# Patient Record
Sex: Male | Born: 1960 | Race: White | Hispanic: No | Marital: Married | Smoking: Never smoker
Health system: Southern US, Community
[De-identification: ages and names within clinical notes are randomized; demographics above are authoritative.]

## PROBLEM LIST (undated history)

## (undated) DIAGNOSIS — G2401 Drug induced subacute dyskinesia: Secondary | ICD-10-CM

## (undated) DIAGNOSIS — F3181 Bipolar II disorder: Secondary | ICD-10-CM

## (undated) DIAGNOSIS — E785 Hyperlipidemia, unspecified: Secondary | ICD-10-CM

## (undated) DIAGNOSIS — R569 Unspecified convulsions: Secondary | ICD-10-CM

## (undated) DIAGNOSIS — T7840XA Allergy, unspecified, initial encounter: Secondary | ICD-10-CM

## (undated) DIAGNOSIS — M1990 Unspecified osteoarthritis, unspecified site: Secondary | ICD-10-CM

## (undated) DIAGNOSIS — G4730 Sleep apnea, unspecified: Secondary | ICD-10-CM

## (undated) DIAGNOSIS — F32A Depression, unspecified: Secondary | ICD-10-CM

## (undated) DIAGNOSIS — R7303 Prediabetes: Secondary | ICD-10-CM

## (undated) DIAGNOSIS — F419 Anxiety disorder, unspecified: Secondary | ICD-10-CM

## (undated) DIAGNOSIS — K219 Gastro-esophageal reflux disease without esophagitis: Secondary | ICD-10-CM

## (undated) DIAGNOSIS — F329 Major depressive disorder, single episode, unspecified: Secondary | ICD-10-CM

## (undated) DIAGNOSIS — R0600 Dyspnea, unspecified: Secondary | ICD-10-CM

## (undated) HISTORY — DX: Gastro-esophageal reflux disease without esophagitis: K21.9

## (undated) HISTORY — DX: Hyperlipidemia, unspecified: E78.5

## (undated) HISTORY — DX: Allergy, unspecified, initial encounter: T78.40XA

## (undated) HISTORY — DX: Depression, unspecified: F32.A

## (undated) HISTORY — DX: Sleep apnea, unspecified: G47.30

## (undated) HISTORY — DX: Bipolar II disorder: F31.81

## (undated) HISTORY — DX: Major depressive disorder, single episode, unspecified: F32.9

---

## 1983-11-27 HISTORY — PX: WISDOM TOOTH EXTRACTION: SHX21

## 2003-04-05 ENCOUNTER — Ambulatory Visit (HOSPITAL_BASED_OUTPATIENT_CLINIC_OR_DEPARTMENT_OTHER): Admission: RE | Admit: 2003-04-05 | Discharge: 2003-04-05 | Payer: Self-pay | Admitting: Family Medicine

## 2003-05-27 ENCOUNTER — Ambulatory Visit (HOSPITAL_BASED_OUTPATIENT_CLINIC_OR_DEPARTMENT_OTHER): Admission: RE | Admit: 2003-05-27 | Discharge: 2003-05-27 | Payer: Self-pay | Admitting: Family Medicine

## 2004-10-12 ENCOUNTER — Emergency Department (HOSPITAL_COMMUNITY): Admission: EM | Admit: 2004-10-12 | Discharge: 2004-10-12 | Payer: Self-pay | Admitting: Emergency Medicine

## 2007-09-29 ENCOUNTER — Encounter: Admission: RE | Admit: 2007-09-29 | Discharge: 2007-09-29 | Payer: Self-pay | Admitting: Family Medicine

## 2007-12-16 ENCOUNTER — Ambulatory Visit (HOSPITAL_COMMUNITY): Admission: RE | Admit: 2007-12-16 | Discharge: 2007-12-16 | Payer: Self-pay | Admitting: Gastroenterology

## 2008-04-12 ENCOUNTER — Encounter: Admission: RE | Admit: 2008-04-12 | Discharge: 2008-04-12 | Payer: Self-pay

## 2008-10-28 ENCOUNTER — Ambulatory Visit (HOSPITAL_COMMUNITY): Payer: Self-pay | Admitting: Psychology

## 2008-11-11 ENCOUNTER — Ambulatory Visit (HOSPITAL_COMMUNITY): Payer: Self-pay | Admitting: Psychology

## 2008-11-23 ENCOUNTER — Ambulatory Visit (HOSPITAL_COMMUNITY): Payer: Self-pay | Admitting: Psychology

## 2009-03-15 ENCOUNTER — Ambulatory Visit (HOSPITAL_COMMUNITY): Payer: Self-pay | Admitting: Psychology

## 2009-03-22 ENCOUNTER — Encounter: Admission: RE | Admit: 2009-03-22 | Discharge: 2009-05-09 | Payer: Self-pay | Admitting: Family Medicine

## 2009-04-14 ENCOUNTER — Ambulatory Visit (HOSPITAL_COMMUNITY): Payer: Self-pay | Admitting: Psychology

## 2009-04-15 ENCOUNTER — Encounter: Admission: RE | Admit: 2009-04-15 | Discharge: 2009-04-15 | Payer: Self-pay | Admitting: Family Medicine

## 2009-04-27 ENCOUNTER — Encounter: Admission: RE | Admit: 2009-04-27 | Discharge: 2009-04-27 | Payer: Self-pay | Admitting: Internal Medicine

## 2009-05-13 ENCOUNTER — Ambulatory Visit (HOSPITAL_COMMUNITY): Payer: Self-pay | Admitting: Psychology

## 2009-11-07 ENCOUNTER — Encounter: Admission: RE | Admit: 2009-11-07 | Discharge: 2009-11-22 | Payer: Self-pay | Admitting: Family Medicine

## 2009-12-01 ENCOUNTER — Encounter: Admission: RE | Admit: 2009-12-01 | Discharge: 2009-12-08 | Payer: Self-pay | Admitting: Family Medicine

## 2010-09-27 ENCOUNTER — Encounter: Admission: RE | Admit: 2010-09-27 | Discharge: 2010-09-27 | Payer: Self-pay | Admitting: Family Medicine

## 2010-12-16 ENCOUNTER — Emergency Department (HOSPITAL_COMMUNITY)
Admission: EM | Admit: 2010-12-16 | Discharge: 2010-12-16 | Payer: Self-pay | Source: Home / Self Care | Admitting: Family Medicine

## 2010-12-16 ENCOUNTER — Emergency Department: Payer: Self-pay | Admitting: Unknown Physician Specialty

## 2010-12-18 ENCOUNTER — Emergency Department (HOSPITAL_COMMUNITY)
Admission: EM | Admit: 2010-12-18 | Discharge: 2010-12-18 | Payer: Self-pay | Source: Home / Self Care | Admitting: Family Medicine

## 2011-04-10 NOTE — Op Note (Signed)
Ryan Phelps, Ryan Phelps                ACCOUNT NO.:  [id number]   MEDICAL RECORD NO.:  [id number]          PATIENT TYPE:  AMB   LOCATION:  ENDO                         FACILITY:  [facility]   PHYSICIAN:  Graylin Shiver, M.D.   DATE OF BIRTH:  04/24/1961   DATE OF PROCEDURE:  12/16/2007  DATE OF DISCHARGE:                               OPERATIVE REPORT   Esophagogastroduodenoscopy with endoscopic balloon dilatation of  Schatzki's ring.   INDICATIONS FOR PROCEDURE:  Dysphagia.   Informed consent was obtained after explanation of the risks of  bleeding, infection and perforation.   PREMEDICATION:  Fentanyl 100 mcg IV, Versed 10 mg IV.   PROCEDURE:  With the patient in the left lateral decubitus position, the  Pentax gastroscope was inserted into the oropharynx and passed into the  esophagus.  It was advanced down the esophagus, then into the stomach  and into the duodenum.  The second portion and bulb of the duodenum were  normal.  The stomach had normal-appearing mucosa.  There was a moderate-  sized hiatal hernia.  In the esophagus, there was a Schatzki's ring  somewhat constricting this was dilated to 15, then 16.5, then 18 mm.  Each level was held in place for 1 minute.  The rest of the esophagus  looked normal.  He tolerated the procedure well without complications.   IMPRESSION:  1. Schatzki's ring.  2. Moderate-sized hiatal hernia.   PLAN:  Observe response to dilatation.           ______________________________  Graylin Shiver, M.D.     SFG/MEDQ  D:  12/16/2007  T:  12/16/2007  Job:  [id number]   cc:   Duncan Dull, M.D.  Fax: [phone]

## 2013-04-27 ENCOUNTER — Ambulatory Visit: Payer: 59 | Attending: Family Medicine | Admitting: Physical Therapy

## 2013-04-27 DIAGNOSIS — M545 Low back pain, unspecified: Secondary | ICD-10-CM | POA: Insufficient documentation

## 2013-04-27 DIAGNOSIS — IMO0001 Reserved for inherently not codable concepts without codable children: Secondary | ICD-10-CM | POA: Insufficient documentation

## 2013-04-27 DIAGNOSIS — M546 Pain in thoracic spine: Secondary | ICD-10-CM | POA: Insufficient documentation

## 2013-04-28 ENCOUNTER — Ambulatory Visit: Payer: 59

## 2013-05-11 ENCOUNTER — Ambulatory Visit: Payer: 59 | Admitting: Physical Therapy

## 2013-05-11 ENCOUNTER — Encounter: Payer: 59 | Admitting: Physical Therapy

## 2013-05-12 ENCOUNTER — Ambulatory Visit: Payer: 59 | Admitting: Physical Therapy

## 2013-05-14 ENCOUNTER — Ambulatory Visit: Payer: 59 | Admitting: Physical Therapy

## 2013-05-18 ENCOUNTER — Ambulatory Visit: Payer: 59 | Admitting: Physical Therapy

## 2013-05-20 ENCOUNTER — Ambulatory Visit: Payer: 59 | Admitting: Physical Therapy

## 2013-05-22 ENCOUNTER — Ambulatory Visit: Payer: 59 | Admitting: Physical Therapy

## 2013-05-25 ENCOUNTER — Ambulatory Visit: Payer: 59 | Admitting: Physical Therapy

## 2013-05-26 ENCOUNTER — Ambulatory Visit: Payer: 59 | Admitting: Physical Therapy

## 2013-05-27 ENCOUNTER — Ambulatory Visit: Payer: 59 | Attending: Family Medicine | Admitting: Physical Therapy

## 2013-05-27 DIAGNOSIS — M545 Low back pain, unspecified: Secondary | ICD-10-CM | POA: Insufficient documentation

## 2013-05-27 DIAGNOSIS — IMO0001 Reserved for inherently not codable concepts without codable children: Secondary | ICD-10-CM | POA: Insufficient documentation

## 2013-05-27 DIAGNOSIS — M546 Pain in thoracic spine: Secondary | ICD-10-CM | POA: Insufficient documentation

## 2013-05-28 ENCOUNTER — Ambulatory Visit: Payer: 59 | Admitting: Physical Therapy

## 2013-06-01 ENCOUNTER — Ambulatory Visit: Payer: 59 | Admitting: Physical Therapy

## 2013-06-03 ENCOUNTER — Ambulatory Visit: Payer: 59 | Admitting: Physical Therapy

## 2013-06-04 ENCOUNTER — Encounter: Payer: 59 | Admitting: Physical Therapy

## 2013-06-04 ENCOUNTER — Ambulatory Visit: Payer: 59

## 2013-06-08 ENCOUNTER — Ambulatory Visit: Payer: 59 | Admitting: Physical Therapy

## 2013-06-10 ENCOUNTER — Ambulatory Visit: Payer: 59 | Admitting: Physical Therapy

## 2013-06-15 ENCOUNTER — Ambulatory Visit: Payer: 59 | Admitting: Physical Therapy

## 2013-06-17 ENCOUNTER — Ambulatory Visit: Payer: 59 | Admitting: Physical Therapy

## 2013-06-22 ENCOUNTER — Ambulatory Visit: Payer: 59 | Admitting: Physical Therapy

## 2014-02-22 ENCOUNTER — Other Ambulatory Visit: Payer: Self-pay | Admitting: Family Medicine

## 2014-02-22 ENCOUNTER — Ambulatory Visit
Admission: RE | Admit: 2014-02-22 | Discharge: 2014-02-22 | Disposition: A | Discharge status: Home / Self Care | Payer: 59 | Source: Ambulatory Visit | Attending: Family Medicine | Admitting: Family Medicine

## 2014-02-22 DIAGNOSIS — M549 Dorsalgia, unspecified: Secondary | ICD-10-CM

## 2015-11-02 ENCOUNTER — Ambulatory Visit: Payer: 59 | Attending: Family Medicine

## 2015-11-02 DIAGNOSIS — M545 Low back pain, unspecified: Secondary | ICD-10-CM

## 2015-11-02 DIAGNOSIS — M25659 Stiffness of unspecified hip, not elsewhere classified: Secondary | ICD-10-CM | POA: Diagnosis present

## 2015-11-02 DIAGNOSIS — G729 Myopathy, unspecified: Secondary | ICD-10-CM | POA: Insufficient documentation

## 2015-11-02 DIAGNOSIS — M6289 Other specified disorders of muscle: Secondary | ICD-10-CM

## 2015-11-02 NOTE — Patient Instructions (Signed)
Perform all exercises below:  Hold _20___ seconds. Repeat _3___ times.  Do __3__ sessions per day. CAUTION: Movement should be gentle, steady and slow.  Knee to Chest  Lying supine, bend involved knee to chest. Perform with each leg.   Lumbar Rotation: Caudal - Bilateral (Supine)  Feet and knees together, arms outstretched, rotate knees left, turning head in opposite direction, until stretch is felt.    Cervico-Thoracic: Extension / Rotation (Sitting)    Reach across body with left arm and grasp back of chair. Gently look over right side shoulder. Hold _20___ seconds. Relax. Repeat __3__ times per set. Do __1__ sets per session. Do __3__ sessions per day.  http://orth.exer.us/980   Copyright  VHI. All rights reserved.    HIP: Hamstrings - Short Sitting   Rest leg on raised surface. Keep knee straight. Lift chest.   [facility] [address], [address] [place], [place] [address] Phone # [phone] Fax [phone]

## 2015-11-02 NOTE — Therapy (Signed)
[facility] [address] [place], [place], [address] Phone: [phone]   Fax:  [phone]  Physical Therapy Evaluation  Patient Details  Name: Ryan Phelps MRN: [id number] Date of Birth: [birth year] Referring Provider: Shaune PollackGates, Donna, MD  Encounter Date: 11/02/2015      PT End of Session - 11/02/15 1605    Visit Number 1   Date for PT Re-Evaluation 12/28/15   PT Start Time 1531   PT Stop Time 1606   PT Time Calculation (min) 35 min   Activity Tolerance Patient tolerated treatment well   Behavior [facility] Therapy [facility] for tasks assessed/performed      Past Medical History  Diagnosis Date  . Depression   . GERD (gastroesophageal reflux disease)   . Hyperlipidemia   . Allergy     History reviewed. No pertinent past surgical history.  There were no vitals filed for this visit.  Visit Diagnosis:  Right-sided low back pain without sciatica - Plan: PT plan of care cert/re-cert  Hip stiffness, unspecified laterality - Plan: PT plan of care cert/re-cert  Muscle stiffness - Plan: PT plan of care cert/re-cert      Subjective Assessment - 11/02/15 1538    Subjective Pt reports to PT with 3-4 year history of Rt>Lt sided LBP. Pt was washing the top of his car and slid off and hit his back.  Pt has had PT in the past and responded well to it.     Limitations Standing   How long can you stand comfortably? 30 minutes   Diagnostic tests none recent.  Old imaging showed lumbar DDD per pt report   Patient Stated Goals reduce pain   Currently in Pain? Yes   Pain Score 8   when it occurs.  No pain now.   Pain Location Back   Pain Orientation Right;Lower;Mid   Pain Descriptors / Marine scientistndicators Shooting;Stabbing   Pain Type Chronic pain   Pain Onset More than a month ago   Pain Frequency Intermittent   Aggravating Factors  yard work (raking leaves), standing   Pain Relieving Factors heat, TENs unit, flat on back             [facility] Assessment - 11/02/15 0001    Assessment   Medical Diagnosis Low back pain (M54.5)   Referring Provider Shaune PollackGates, Donna, MD   Onset Date/Surgical Date 07/02/12   Next MD Visit none   Prior Therapy 2 years ago at this clinic   Precautions   Precautions None   Restrictions   Weight Bearing Restrictions No   Balance Screen   Has the patient fallen in the past 6 months No   Has the patient had a decrease in activity level because of a fear of falling?  No   Is the patient reluctant to leave their home because of a fear of falling?  No   Home Environment   Living Environment Private residence   Living Arrangements Spouse/significant other   Type of Home House   Home Layout Two level   Prior Function   Level of Independence Independent   Vocation On disability  due to depression   Leisure drawing, writing   Cognition   Overall Cognitive Status Within Functional Limits for tasks assessed   Observation/Other Assessments   Focus on Therapeutic Outcomes (FOTO)  52% limitation   Posture/Postural Control   Posture/Postural Control Postural limitations   Postural Limitations Decreased lumbar lordosis;Increased thoracic kyphosis;Flexed trunk   ROM /  Strength   AROM / PROM / Strength AROM;PROM;Strength   AROM   Overall AROM  Deficits   Overall AROM Comments Lumbar flexion limited by 50% with pain, sidebending limited by 25% with Rt pain with Lt sidebending, rotation limited by 25% with pain with Lt rotation.   PROM   Overall PROM  Deficits   Overall PROM Comments Bil hip flexibility limited by 25-50% bilaterally   Strength   Overall Strength Deficits   Overall Strength Comments Bil hip and knees 4+/5 throughout   Palpation   Spinal mobility significant mobility deficits (>50%) in the thoracic and lumbar spine   Palpation comment Palpable tenderness and trigger point over Rt thoracic and lumbar paraspinals.  Tension on the Lt without pain,     Ambulation/Gait    Ambulation/Gait Yes   Ambulation/Gait Assistance 7: Independent                           PT Education - 11/02/15 1559    Education provided Yes   Education Details HEP: lumbar flexibility, seated hamstring, seated thoracic rotation   Person(s) Educated Patient   Methods Explanation;Demonstration;Handout   Comprehension Verbalized understanding;Returned demonstration          PT Short Term Goals - 11/02/15 1609    PT SHORT TERM GOAL #1   Title be independent in initial HEP   Time 4   Period Weeks   Status New   PT SHORT TERM GOAL #2   Title report a 25% reduction in LBP with ADLs and self-care   Time 4   Period Weeks   Status New   PT SHORT TERM GOAL #3   Title demonstrate correct body mechanics with home tasks to protect lumbar spine.   Time 4   Period Weeks   Status New           PT Long Term Goals - 11/02/15 1531    PT LONG TERM GOAL #1   Title be independent in advanced HEP   Time 8   Period Weeks   Status New   PT LONG TERM GOAL #2   Title reduce FOTO to < or = to 41% limitation   Time 8   Period Weeks   Status New   PT LONG TERM GOAL #3   Title report a 60% reduction in LBP/thoracic pain with ADLs and self-care   Time 8   Period Weeks   Status New               Plan - 11/02/15 1606    Clinical Impression Statement Pt presents to PT with 3-4 year history of chronic Rt sided thoracic and lumbar pain.  Pt demonstrates limited lumbar and hip flexibility, reduced segmental mobility and tension/active trigger points in the Rt thoracic and lumbar spine.  FOTO score is 52% limitation.  Pt will benefit from skilled PT for body mechanics education, flexibiity and manual/modalities to imrpove mobility and reduce pain with ADLs and home tasks.     Pt will benefit from skilled therapeutic intervention in order to improve on the following deficits Postural dysfunction;Decreased mobility;Hypomobility;Impaired flexibility;Improper body  mechanics;Pain;Decreased activity tolerance;Decreased endurance;Decreased range of motion   Rehab Potential Good   PT Frequency 2x / week   PT Duration 8 weeks   PT Treatment/Interventions ADLs/Self Care Home Management;Cryotherapy;Electrical Stimulation;Moist Heat;Therapeutic exercise;Therapeutic activities;Ultrasound;Neuromuscular re-education;Patient/family education;Manual techniques;Dry needling;Passive range of motion   PT Next Visit Plan Body mechanics education.  Advance flexibility  in the thoracic and lumbar spine.  Manual and modalities to thoracic/lumbar spine and Rt paraspinals   Consulted and Agree with Plan of Care Patient         Problem List There are no active problems to display for this patient.   Monte Bronder, PT 11/02/2015, 4:14 PM  [facility] [address], [address] [place], [place], [address] Phone: [phone]   Fax:  [phone]  Name: Ryan Phelps MRN: [id number] Date of Birth: [birth year]

## 2015-11-08 ENCOUNTER — Ambulatory Visit: Payer: 59

## 2015-11-08 DIAGNOSIS — M545 Low back pain, unspecified: Secondary | ICD-10-CM

## 2015-11-08 DIAGNOSIS — M25659 Stiffness of unspecified hip, not elsewhere classified: Secondary | ICD-10-CM

## 2015-11-08 DIAGNOSIS — M6289 Other specified disorders of muscle: Secondary | ICD-10-CM

## 2015-11-08 NOTE — Therapy (Signed)
[facility] [address], [address] [place], [place], [address] Phone: [phone]   Fax:  [phone]  Physical Therapy Treatment  Patient Details  Name: Ryan Phelps MRN: [id number] Date of Birth: Mar 30, 1961 Referring Provider: Shaune Pollack, MD  Encounter Date: 11/08/2015      PT End of Session - 11/08/15 1444    Visit Number 2   Date for PT Re-Evaluation 12/28/15   PT Start Time 1400   PT Stop Time 1444   PT Time Calculation (min) 44 min   Activity Tolerance Patient tolerated treatment well   Behavior During Therapy [facility] for tasks assessed/performed      Past Medical History  Diagnosis Date  . Depression   . GERD (gastroesophageal reflux disease)   . Hyperlipidemia   . Allergy     History reviewed. No pertinent past surgical history.  There were no vitals filed for this visit.  Visit Diagnosis:  Right-sided low back pain without sciatica  Hip stiffness, unspecified laterality  Muscle stiffness      Subjective Assessment - 11/08/15 1401    Subjective Pt reports compliance with HEP.  No changes in pain since the start of care.     Currently in Pain? Yes   Pain Score 7    Pain Location Back   Pain Orientation Upper;Right;Mid;Lower   Pain Descriptors / Indicators Shooting;Stabbing   Pain Type Chronic pain   Pain Onset More than a month ago   Pain Frequency Intermittent   Aggravating Factors  yard work, raking leaves, standing   Pain Relieving Factors heat, TENs unit, flat on back                         [facility] Adult PT Treatment/Exercise - 11/08/15 0001    Exercises   Exercises Shoulder;Lumbar;Knee/Hip   Lumbar Exercises: Stretches   Active Hamstring Stretch 3 reps;20 seconds   Single Knee to Chest Stretch 3 reps;20 seconds   Lower Trunk Rotation 3 reps;20 seconds   Lumbar Exercises: Seated   Other Seated Lumbar Exercises seated thoracolumbar rotation 3x20 seconds   Other Seated  Lumbar Exercises rhomboid stretch with arms crossed over legs 3x20 seconds   Lumbar Exercises: Supine   Other Supine Lumbar Exercises supine on foam roll for decompression x 4 minutes   Shoulder Exercises: Supine   Horizontal ABduction Strengthening;20 reps;Theraband  on foam roll   Shoulder Exercises: ROM/Strengthening   UBE (Upper Arm Bike) Level 1x 6 minutes (3/3)  verbal cues for posture   Manual Therapy   Manual Therapy Joint mobilization;Soft tissue mobilization   Manual therapy comments PA glides to T3-L1 grade 3   Soft tissue mobilization orange roller ball to mobilize Rt thoracic and lumbar parapsinals                  PT Short Term Goals - 11/08/15 1405    PT SHORT TERM GOAL #1   Title be independent in initial HEP   Time 4   Period Weeks   Status On-going  1st visit after evaluation   PT SHORT TERM GOAL #2   Title report a 25% reduction in LBP with ADLs and self-care   Time 4   Period Weeks   Status On-going           PT Long Term Goals - 11/02/15 1531    PT LONG TERM GOAL #1   Title be independent in advanced HEP   Time  8   Period Weeks   Status New   PT LONG TERM GOAL #2   Title reduce FOTO to < or = to 41% limitation   Time 8   Period Weeks   Status New   PT LONG TERM GOAL #3   Title report a 60% reduction in LBP/thoracic pain with ADLs and self-care   Time 8   Period Weeks   Status New               Plan - 11/08/15 1407    Clinical Impression Statement Pt with only 1 session after evaluation.  Pt with chronic history of Rt sided thoracic and lumbar pain.  Pt demonstrates limited lumbar and hip flexibility, reduced segmental mobiliity and tension/active trigger points in the Rt thoracic and lumbar spine.  Pt will benefit from skilled PT for body mechanics education, flexibility and manual/modalities to improve mobility and reduce pain with ADLs and home tasks.     Pt will benefit from skilled therapeutic intervention in order to  improve on the following deficits Postural dysfunction;Decreased mobility;Hypomobility;Impaired flexibility;Improper body mechanics;Pain;Decreased activity tolerance;Decreased endurance;Decreased range of motion   Rehab Potential Good   PT Frequency 2x / week   PT Duration 8 weeks   PT Treatment/Interventions ADLs/Self Care Home Management;Cryotherapy;Electrical Stimulation;Moist Heat;Therapeutic exercise;Therapeutic activities;Ultrasound;Neuromuscular re-education;Patient/family education;Manual techniques;Dry needling;Passive range of motion   PT Next Visit Plan Body mechanics education.  Advance flexibility in the thoracic and lumbar spine.  Manual and modalities to thoracic/lumbar spine and Rt paraspinals   Consulted and Agree with Plan of Care Patient        Problem List There are no active problems to display for this patient.   TAKACS,KELLY, PT 11/08/2015, 2:46 PM  [facility] [address], [address] [place], [place], [address] Phone: [phone]   Fax:  [phone]  Name: Richardean Canalhomas L Hadlock MRN: [id number] Date of Birth: 05-Nov-1961

## 2015-11-10 ENCOUNTER — Encounter: Payer: 59 | Admitting: Physical Therapy

## 2015-11-15 ENCOUNTER — Ambulatory Visit: Payer: 59

## 2015-11-15 DIAGNOSIS — M545 Low back pain, unspecified: Secondary | ICD-10-CM

## 2015-11-15 DIAGNOSIS — M6289 Other specified disorders of muscle: Secondary | ICD-10-CM

## 2015-11-15 DIAGNOSIS — M25659 Stiffness of unspecified hip, not elsewhere classified: Secondary | ICD-10-CM

## 2015-11-15 NOTE — Patient Instructions (Signed)
   Lifting Principles  .Maintain proper posture and head alignment. .Slide object as close as possible before lifting. .Move obstacles out of the way. .Test before lifting; ask for help if too heavy. .Tighten stomach muscles without holding breath. .Use smooth movements; do not jerk. .Use legs to do the work, and pivot with feet. .Distribute the work load symmetrically and close to the center of trunk. .Push instead of pull whenever possible.   Squat down and hold basket close to stand. Use leg muscles to do the work.    Avoid twisting or bending back. Pivot around using foot movements, and bend at knees if needed when reaching for articles.        Getting Into / Out of Bed   Lower self to lie down on one side by raising legs and lowering head at the same time. Use arms to assist moving without twisting. Bend both knees to roll onto back if desired. To sit up, start from lying on side, and use same move-ments in reverse. Keep trunk aligned with legs.    Shift weight from front foot to back foot as item is lifted off shelf.    When leaning forward to pick object up from floor, extend one leg out behind. Keep back straight. Hold onto a sturdy support with other hand.      Sit upright, head facing forward. Try using a roll to support lower back. Keep shoulders relaxed, and avoid rounded back. Keep hips level with knees. Avoid crossing legs for long periods.    Resisted Horizontal Abduction: Bilateral   Sit or stand, tubing in both hands, arms out in front. Keeping arms straight, pinch shoulder blades together and stretch arms out. Repeat _10___ times per set. Do 2____ sets per session. Do _1-2___ sessions per day.    Scapular Retraction: Elbow Flexion (Standing)   With elbows bent to 90, pinch shoulder blades together and rotate arms out, keeping elbows bent. Repeat _10___ times per set. Do _1___ sets per session. Do many____ sessions per day.   [facility] [address], [address] [place], [place] [address] Phone # [phone] Fax [phone]

## 2015-11-15 NOTE — Therapy (Signed)
[facility] [address] [place], [place], [address] Phone: [phone]   Fax:  [phone]  Physical Therapy Treatment  Patient Details  Name: Ryan Phelps L Rozell MRN: [id number] Date of Birth: Sep 16, 1961 Referring Provider: Shaune PollackGates, Donna, MD  Encounter Date: 11/15/2015      PT End of Session - 11/15/15 0910    Visit Number 3   Date for PT Re-Evaluation 12/28/15   PT Start Time 0845   PT Stop Time 0924   PT Time Calculation (min) 39 min   Activity Tolerance Patient tolerated treatment well   Behavior During Therapy [facility] for tasks assessed/performed      Past Medical History  Diagnosis Date  . Depression   . GERD (gastroesophageal reflux disease)   . Hyperlipidemia   . Allergy     History reviewed. No pertinent past surgical history.  There were no vitals filed for this visit.  Visit Diagnosis:  Right-sided low back pain without sciatica  Hip stiffness, unspecified laterality  Muscle stiffness      Subjective Assessment - 11/15/15 0851    Subjective Feeling about the same.  Pt had a death in the family and had to travel over the weekend.     Diagnostic tests none recent.  Old imaging showed lumbar DDD per pt report   Currently in Pain? Yes   Pain Score 5    Pain Location Back   Pain Orientation Upper;Mid;Lower;Right   Pain Descriptors / Indicators Shooting;Stabbing   Pain Type Chronic pain   Pain Onset More than a month ago   Pain Frequency Intermittent   Aggravating Factors  yard work, raking leaves, standing    Pain Relieving Factors heat, TENs unit, flat on back                         [facility] Adult PT Treatment/Exercise - 11/15/15 0001    Lumbar Exercises: Stretches   Active Hamstring Stretch 3 reps;20 seconds   Single Knee to Chest Stretch 3 reps;20 seconds   Lower Trunk Rotation 3 reps;20 seconds   Lumbar Exercises: Seated   Other Seated Lumbar Exercises seated  thoracolumbar rotation 3x20 seconds   Other Seated Lumbar Exercises rhomboid stretch with arms crossed over legs 3x20 seconds   Lumbar Exercises: Supine   Other Supine Lumbar Exercises supine on foam roll for decompression x 4 minutes   Shoulder Exercises: Supine   Horizontal ABduction Strengthening;20 reps;Theraband  on foam roll   Shoulder Exercises: ROM/Strengthening   UBE (Upper Arm Bike) Level 1x 6 minutes (3/3)  verbal cues for posture   Manual Therapy   Manual Therapy Joint mobilization;Soft tissue mobilization   Manual therapy comments PA glides to T3-L1 grade 3   Soft tissue mobilization orange roller ball to mobilize Rt thoracic and lumbar parapsinals                PT Education - 11/15/15 0904    Education provided Yes   Education Details Horizontal abduction, body mechanics education   Person(s) Educated Patient   Methods Explanation;Demonstration;Handout   Comprehension Verbalized understanding;Returned demonstration          PT Short Term Goals - 11/15/15 0854    PT SHORT TERM GOAL #1   Title be independent in initial HEP   Status Achieved   PT SHORT TERM GOAL #2   Title report a 25% reduction in LBP with ADLs and self-care   Time 4  Period Weeks   Status On-going  10% improvement   PT SHORT TERM GOAL #3   Status Achieved           PT Long Term Goals - 11/02/15 1531    PT LONG TERM GOAL #1   Title be independent in advanced HEP   Time 8   Period Weeks   Status New   PT LONG TERM GOAL #2   Title reduce FOTO to < or = to 41% limitation   Time 8   Period Weeks   Status New   PT LONG TERM GOAL #3   Title report a 60% reduction in LBP/thoracic pain with ADLs and self-care   Time 8   Period Weeks   Status New               Plan - 11/15/15 1610    Clinical Impression Statement Pt is independent and compliant with HEP for flexibility and pt reports that he feels 10% better since the start of care.  Pt demonstrates limited lumbar  and hip flexibility, reduced segmental mobility and tension/active trigger points in the Rt thoracic and lumbar spine.  Pt will benefit from skilled PT for flexibility, strength and manual/modalities to improve mobility and reduce pain with ADLs and home tasks.   Pt will benefit from skilled therapeutic intervention in order to improve on the following deficits Postural dysfunction;Decreased mobility;Hypomobility;Impaired flexibility;Improper body mechanics;Pain;Decreased activity tolerance;Decreased endurance;Decreased range of motion   Rehab Potential Good   PT Frequency 2x / week   PT Duration 8 weeks   PT Treatment/Interventions ADLs/Self Care Home Management;Cryotherapy;Electrical Stimulation;Moist Heat;Therapeutic exercise;Therapeutic activities;Ultrasound;Neuromuscular re-education;Patient/family education;Manual techniques;Dry needling;Passive range of motion   PT Next Visit Plan   Advance flexibility in the thoracic and lumbar spine.  Manual and modalities to thoracic/lumbar spine and Rt paraspinals   Consulted and Agree with Plan of Care Patient        Problem List There are no active problems to display for this patient.   [name], PT 11/15/2015, 9:27 AM  [facility] [address], [address] [place], [place], [address] Phone: [phone]   Fax:  [phone]  Name: Ryan Phelps MRN: [id number] Date of Birth: 07/11/1961

## 2015-11-17 ENCOUNTER — Encounter: Payer: Self-pay | Admitting: Rehabilitation

## 2015-11-17 ENCOUNTER — Ambulatory Visit: Payer: 59 | Admitting: Rehabilitation

## 2015-11-17 DIAGNOSIS — M6289 Other specified disorders of muscle: Secondary | ICD-10-CM

## 2015-11-17 DIAGNOSIS — M545 Low back pain, unspecified: Secondary | ICD-10-CM

## 2015-11-17 DIAGNOSIS — M25659 Stiffness of unspecified hip, not elsewhere classified: Secondary | ICD-10-CM

## 2015-11-17 NOTE — Therapy (Signed)
[facility] [address], [address] [place], [place], [address] Phone: [phone]   Fax:  [phone]  Physical Therapy Treatment  Patient Details  Name: Ryan Phelps MRN: [id number] Date of Birth: 24-Apr-1961 Referring Provider: Shaune PollackGates, Donna, MD  Encounter Date: 11/17/2015      PT End of Session - 11/17/15 1440    Visit Number 4   Date for PT Re-Evaluation 12/28/15   PT Start Time 1400   PT Stop Time 1455   PT Time Calculation (min) 55 min   Activity Tolerance Patient tolerated treatment well      Past Medical History  Diagnosis Date  . Depression   . GERD (gastroesophageal reflux disease)   . Hyperlipidemia   . Allergy     History reviewed. No pertinent past surgical history.  There were no vitals filed for this visit.  Visit Diagnosis:  Right-sided low back pain without sciatica  Hip stiffness, unspecified laterality  Muscle stiffness      Subjective Assessment - 11/17/15 1402    Subjective Feeling better.  loosening up overall.  pain is still upper and lower   Currently in Pain? Yes   Pain Score 3    Pain Location Back   Pain Orientation Mid   Aggravating Factors  yard work, raking leaves, standing   Pain Relieving Factors heat, TENS, flat on back            [facility] PT Assessment - 11/17/15 0001    AROM   Overall AROM Comments lumbar flexion to knees, extension limited 80%, pain also with sidebending bilaterally in the R QL region.  Seated thoracic rotation also limted >50% bilaterally with pain                     [facility] Adult PT Treatment/Exercise - 11/17/15 0001    Lumbar Exercises: Stretches   Lower Trunk Rotation 2 reps;30 seconds   Lower Trunk Rotation Limitations using bolster to rest knees and arms in T position   Lumbar Exercises: Aerobic   UBE (Upper Arm Bike) Level 1 x 6min (3/3)   Lumbar Exercises: Seated   Other Seated Lumbar Exercises seated thoracic rotation  stretch with manual work to R QL; Bil x 2   Lumbar Exercises: Supine   Other Supine Lumbar Exercises supine on foam roll for decompression x 4 minutes   Lumbar Exercises: Prone   Other Prone Lumbar Exercises PPU with hips held down x 8   Shoulder Exercises: Supine   Horizontal ABduction Strengthening;20 reps;Theraband   Theraband Level (Shoulder Horizontal ABduction) Level 2 (Red)   Modalities   Modalities Electrical Stimulation;Moist Heat   Moist Heat Therapy   Number Minutes Moist Heat 15 Minutes   Moist Heat Location Lumbar Spine   Electrical Stimulation   Electrical Stimulation Location R lumbar spine   Electrical Stimulation Action IFC   Electrical Stimulation Parameters to tolerance   Electrical Stimulation Goals Pain   Manual Therapy   Manual Therapy Joint mobilization;Soft tissue mobilization   Manual therapy comments CPAs T10-L3 grade IV 3x30   Joint Mobilization     Soft tissue mobilization to R QL region                  PT Short Term Goals - 11/15/15 0854    PT SHORT TERM GOAL #1   Title be independent in initial HEP   Status Achieved   PT SHORT TERM GOAL #2   Title report  a 25% reduction in LBP with ADLs and self-care   Time 4   Period Weeks   Status On-going  10% improvement   PT SHORT TERM GOAL #3   Status Achieved           PT Long Term Goals - 11/02/15 1531    PT LONG TERM GOAL #1   Title be independent in advanced HEP   Time 8   Period Weeks   Status New   PT LONG TERM GOAL #2   Title reduce FOTO to < or = to 41% limitation   Time 8   Period Weeks   Status New   PT LONG TERM GOAL #3   Title report a 60% reduction in LBP/thoracic pain with ADLs and self-care   Time 8   Period Weeks   Status New               Plan - 11/17/15 1440    Clinical Impression Statement Lumbar AROM into flexion and rotation improved by >25% post treatment and manual work.  did MH and TENS today due to patient not being able to do it at home with  his wife OOT.     PT Next Visit Plan   Advance flexibility in the thoracic and lumbar spine.  Manual and modalities to thoracic/lumbar spine and Rt paraspinals        Problem List There are no active problems to display for this patient.   Idamae Lusher, DPT, CMP 11/17/2015, 2:42 PM  [facility] [address], [address] [place], [place], [address] Phone: [phone]   Fax:  [phone]  Name: Ryan Phelps MRN: [id number] Date of Birth: Jul 01, 1961

## 2015-11-29 ENCOUNTER — Ambulatory Visit: Payer: 59 | Attending: Family Medicine

## 2015-11-29 DIAGNOSIS — M25659 Stiffness of unspecified hip, not elsewhere classified: Secondary | ICD-10-CM | POA: Diagnosis present

## 2015-11-29 DIAGNOSIS — G729 Myopathy, unspecified: Secondary | ICD-10-CM | POA: Insufficient documentation

## 2015-11-29 DIAGNOSIS — M545 Low back pain, unspecified: Secondary | ICD-10-CM

## 2015-11-29 DIAGNOSIS — M6289 Other specified disorders of muscle: Secondary | ICD-10-CM

## 2015-11-29 NOTE — Therapy (Signed)
[facility] [address], [address] [place], [place], [address] Phone: [phone]   Fax:  [phone]  Physical Therapy Treatment  Patient Details  Name: Ryan Phelps MRN: [id number] Date of Birth: 09/08/1961 Referring Provider: Shaune PollackGates, Donna, MD  Encounter Date: 11/29/2015      PT End of Session - 11/29/15 1438    Visit Number 4   Date for PT Re-Evaluation 12/28/15   PT Start Time 1400   PT Stop Time 1455   PT Time Calculation (min) 55 min   Activity Tolerance Patient tolerated treatment well   Behavior During Therapy [facility] for tasks assessed/performed      Past Medical History  Diagnosis Date  . Depression   . GERD (gastroesophageal reflux disease)   . Hyperlipidemia   . Allergy     History reviewed. No pertinent past surgical history.  There were no vitals filed for this visit.  Visit Diagnosis:  Right-sided low back pain without sciatica  Hip stiffness, unspecified laterality  Muscle stiffness      Subjective Assessment - 11/29/15 1404    Subjective Pt reports that he has been doing a lot of traveling and has not been as sore/painful as he thought that he would be.     Currently in Pain? Yes   Pain Score 3    Pain Location Back   Pain Orientation Mid   Pain Descriptors / Indicators Shooting;Stabbing   Pain Type Chronic pain   Pain Onset More than a month ago   Pain Frequency Intermittent   Aggravating Factors  long car rides, sitting too long, twisting   Pain Relieving Factors heat, laying flat/getting spine straight                         [facility] Adult PT Treatment/Exercise - 11/29/15 0001    Lumbar Exercises: Stretches   Lower Trunk Rotation 2 reps;30 seconds   Lumbar Exercises: Supine   Other Supine Lumbar Exercises supine on foam roll for decompression x 4 minutes   Shoulder Exercises: Supine   Horizontal ABduction Strengthening;20 reps;Theraband   Theraband Level (Shoulder  Horizontal ABduction) Level 2 (Red)   Other Supine Exercises D2 with red band 2x10 bil. performed on foam roll   Shoulder Exercises: ROM/Strengthening   UBE (Upper Arm Bike) Level 1x 6 minutes (3/3)  verbal cues for posture   Modalities   Modalities Electrical Stimulation;Moist Heat   Moist Heat Therapy   Number Minutes Moist Heat 15 Minutes   Moist Heat Location Lumbar Spine   Electrical Stimulation   Electrical Stimulation Location R lumbar spine   Electrical Stimulation Action IFC   Electrical Stimulation Parameters 15 minutes   Electrical Stimulation Goals Pain   Manual Therapy   Manual Therapy Joint mobilization;Soft tissue mobilization   Manual therapy comments PA glides to T3-L1 grade 3   Soft tissue mobilization to Rt QL region and use of orange roller ball for tissue mobilization                  PT Short Term Goals - 11/29/15 1407    PT SHORT TERM GOAL #1   Title be independent in initial HEP   Status Achieved   PT SHORT TERM GOAL #2   Title report a 25% reduction in LBP with ADLs and self-care   Status Achieved   PT SHORT TERM GOAL #3   Title demonstrate correct body mechanics with home tasks to protect  lumbar spine.   Status Achieved           PT Long Term Goals - 11/29/15 1406    PT LONG TERM GOAL #1   Title be independent in advanced HEP   Time 8   Period Weeks   Status On-going   PT LONG TERM GOAL #3   Title report a 60% reduction in LBP/thoracic pain with ADLs and self-care   Time 8   Period Weeks   Status On-going  50% better               Plan - 11/29/15 1407    Clinical Impression Statement Pt reports 50% overall improvement since the start of care.  Pt has taken some longer car rides over the past 2 weeks and reports that his pain has not been bad.  Pt is independent in current HEP for flexibility and strength.  Pt will continue to benefit from skilled PT for strength, manual and flexibility.     Pt will benefit from skilled  therapeutic intervention in order to improve on the following deficits Postural dysfunction;Decreased mobility;Hypomobility;Impaired flexibility;Improper body mechanics;Pain;Decreased activity tolerance;Decreased endurance;Decreased range of motion   Rehab Potential Good   PT Frequency 2x / week   PT Duration 8 weeks   PT Treatment/Interventions ADLs/Self Care Home Management;Cryotherapy;Electrical Stimulation;Moist Heat;Therapeutic exercise;Therapeutic activities;Ultrasound;Neuromuscular re-education;Patient/family education;Manual techniques;Dry needling;Passive range of motion   PT Next Visit Plan   Advance flexibility in the thoracic and lumbar spine.  Manual and modalities to thoracic/lumbar spine and Rt paraspinals   Consulted and Agree with Plan of Care Patient        Problem List There are no active problems to display for this patient.   Govani Radloff, PT 11/29/2015, 2:39 PM  [facility] [address], [address] [place], [place], [address] Phone: [phone]   Fax:  [phone]  Name: Ryan Phelps MRN: [id number] Date of Birth: 10/27/1961

## 2015-12-01 ENCOUNTER — Ambulatory Visit: Payer: 59

## 2015-12-01 DIAGNOSIS — M545 Low back pain, unspecified: Secondary | ICD-10-CM

## 2015-12-01 DIAGNOSIS — M6289 Other specified disorders of muscle: Secondary | ICD-10-CM

## 2015-12-01 DIAGNOSIS — M25659 Stiffness of unspecified hip, not elsewhere classified: Secondary | ICD-10-CM

## 2015-12-01 NOTE — Therapy (Signed)
[facility] [address] [place], [place], [address] Phone: [phone]   Fax:  [phone]  Physical Therapy Treatment  Patient Details  Name: NADIA TORR MRN: [id number] Date of Birth: [birth year] Referring Provider: Shaune Pollack, MD  Encounter Date: 12/01/2015      PT End of Session - 12/01/15 1445    Visit Number 5   Date for PT Re-Evaluation 12/28/15   PT Start Time 1359   PT Stop Time 1500   PT Time Calculation (min) 61 min   Activity Tolerance Patient tolerated treatment well   Behavior During Therapy [facility] for tasks assessed/performed      Past Medical History  Diagnosis Date  . Depression   . GERD (gastroesophageal reflux disease)   . Hyperlipidemia   . Allergy     History reviewed. No pertinent past surgical history.  There were no vitals filed for this visit.  Visit Diagnosis:  Right-sided low back pain without sciatica  Hip stiffness, unspecified laterality  Muscle stiffness      Subjective Assessment - 12/01/15 1403    Subjective Feeling stiff today   Currently in Pain? Yes   Pain Score 4    Pain Location Back   Pain Orientation Mid   Pain Descriptors / Indicators Shooting;Stabbing   Pain Type Chronic pain   Pain Onset More than a month ago   Pain Frequency Intermittent   Aggravating Factors  long car rides, sitting too long, twisting   Pain Relieving Factors heat, laying flat                         [facility] Adult PT Treatment/Exercise - 12/01/15 0001    Lumbar Exercises: Stretches   Active Hamstring Stretch 3 reps;20 seconds   Single Knee to Chest Stretch 3 reps;20 seconds   Lower Trunk Rotation 2 reps;30 seconds   Lumbar Exercises: Supine   Other Supine Lumbar Exercises supine on foam roll for decompression x 4 minutes   Shoulder Exercises: Supine   Horizontal ABduction Strengthening;20 reps;Theraband   Theraband Level (Shoulder Horizontal ABduction) Level 2  (Red)   Other Supine Exercises D2 with red band 2x10 bil. performed on foam roll   Shoulder Exercises: Seated   Flexion 20 reps;Weights;Strengthening;Both   Flexion Weight (lbs) 1   Abduction Both;20 reps;Weights;Strengthening  also scaption   ABduction Weight (lbs) 1   Shoulder Exercises: ROM/Strengthening   UBE (Upper Arm Bike) Level 1x 6 minutes (3/3)  verbal cues for posture   Modalities   Modalities Electrical Stimulation   Moist Heat Therapy   Number Minutes Moist Heat 15 Minutes   Moist Heat Location Lumbar Spine   Electrical Stimulation   Electrical Stimulation Location R lumbar spine   Electrical Stimulation Action IFC   Electrical Stimulation Parameters 15 minutes   Electrical Stimulation Goals Pain   Manual Therapy   Manual Therapy Joint mobilization;Soft tissue mobilization   Manual therapy comments PA glides to T3-L1 grade 3   Soft tissue mobilization to Rt QL region and use of orange roller ball for tissue mobilization                  PT Short Term Goals - 11/29/15 1407    PT SHORT TERM GOAL #1   Title be independent in initial HEP   Status Achieved   PT SHORT TERM GOAL #2   Title report a 25% reduction in LBP with ADLs and self-care  Status Achieved   PT SHORT TERM GOAL #3   Title demonstrate correct body mechanics with home tasks to protect lumbar spine.   Status Achieved           PT Long Term Goals - 11/29/15 1406    PT LONG TERM GOAL #1   Title be independent in advanced HEP   Time 8   Period Weeks   Status On-going   PT LONG TERM GOAL #3   Title report a 60% reduction in LBP/thoracic pain with ADLs and self-care   Time 8   Period Weeks   Status On-going  50% better               Plan - 12/01/15 1404    Clinical Impression Statement Pt reports 50% overall improvement since the start of care.  Pt reports increased stiffness today.  He continues to stretch at home regularly.  Pt with continued stiffness, flexibility  deficits and pain.  Pt will continue to benefit from skilled PT for strength, manual and flexibility.     Pt will benefit from skilled therapeutic intervention in order to improve on the following deficits Postural dysfunction;Decreased mobility;Hypomobility;Impaired flexibility;Improper body mechanics;Pain;Decreased activity tolerance;Decreased endurance;Decreased range of motion   Rehab Potential Good   PT Frequency 2x / week   PT Duration 8 weeks   PT Treatment/Interventions ADLs/Self Care Home Management;Cryotherapy;Electrical Stimulation;Moist Heat;Therapeutic exercise;Therapeutic activities;Ultrasound;Neuromuscular re-education;Patient/family education;Manual techniques;Dry needling;Passive range of motion   PT Next Visit Plan   Advance strength in the thoracic and lumbar spine.  Manual and modalities to thoracic/lumbar spine and Rt paraspinals.  1 more session probable to finalize HEP (add 3 way raises), FOTO   Consulted and Agree with Plan of Care Patient        Problem List There are no active problems to display for this patient.   Trinidy Masterson, PT 12/01/2015, 2:47 PM  [facility] [address], [address] [place], [place], [address] Phone: [phone]   Fax:  [phone]  Name: Richardean Canalhomas L Estrada MRN: [id number] Date of Birth: Aug 07, 1961

## 2015-12-06 ENCOUNTER — Encounter: Payer: 59 | Admitting: Physical Therapy

## 2015-12-08 ENCOUNTER — Encounter: Payer: 59 | Admitting: Physical Therapy

## 2015-12-14 ENCOUNTER — Ambulatory Visit: Payer: 59

## 2015-12-14 DIAGNOSIS — M6289 Other specified disorders of muscle: Secondary | ICD-10-CM

## 2015-12-14 DIAGNOSIS — M545 Low back pain, unspecified: Secondary | ICD-10-CM

## 2015-12-14 DIAGNOSIS — M25659 Stiffness of unspecified hip, not elsewhere classified: Secondary | ICD-10-CM

## 2015-12-14 NOTE — Therapy (Signed)
[facility] [address], [place], [place], [address] Phone: [phone]   Fax:  [phone]  Physical Therapy Treatment  Patient Details  Name: Ryan Phelps MRN: [id number] Date of Birth: [birth year] Referring Provider: Darcus Austin, MD  Encounter Date: 12/14/2015      PT End of Session - 12/14/15 1438    Visit Number 6   PT Start Time 4010   PT Stop Time 1455   PT Time Calculation (min) 48 min   Activity Tolerance Patient tolerated treatment well   Behavior During Therapy [facility] for tasks assessed/performed      Past Medical History  Diagnosis Date  . Depression   . GERD (gastroesophageal reflux disease)   . Hyperlipidemia   . Allergy     History reviewed. No pertinent past surgical history.  There were no vitals filed for this visit.  Visit Diagnosis:  Right-sided low back pain without sciatica  Hip stiffness, unspecified laterality  Muscle stiffness      Subjective Assessment - 12/14/15 1416    Subjective 75% overall improvement.  Ready for D/C.   Currently in Pain? Yes   Pain Score 3    Pain Location Back   Pain Orientation Mid;Right   Pain Descriptors / Indicators Shooting;Stabbing   Pain Type Chronic pain   Pain Onset More than a month ago   Pain Frequency Intermittent   Aggravating Factors  driving long distances, sitting too long, twisting   Pain Relieving Factors heat, laying flat            [facility] PT Assessment - 12/14/15 0001    Assessment   Medical Diagnosis Low back pain (M54.5)   Onset Date/Surgical Date 07/02/12   Home Environment   Living Environment Private residence   Living Arrangements Spouse/significant other   Type of [place] Two level   Prior Function   Level of Independence Independent   Vocation On disability  due to depression   Leisure drawing, writing   Cognition   Overall Cognitive Status Within Functional Limits for tasks assessed   Observation/Other Assessments   Focus on Therapeutic Outcomes (FOTO)  44% limitation   Posture/Postural Control   Posture/Postural Control Postural limitations   Postural Limitations Decreased lumbar lordosis;Increased thoracic kyphosis;Flexed trunk   Palpation   Palpation comment Palpable tenderness and trigger point over Rt thoracic and lumbar paraspinals.  Tension on the Lt without pain,                       [facility] Adult PT Treatment/Exercise - 12/14/15 0001    Lumbar Exercises: Stretches   Active Hamstring Stretch 3 reps;20 seconds   Single Knee to Chest Stretch 3 reps;20 seconds   Lower Trunk Rotation 2 reps;30 seconds   Lumbar Exercises: Supine   Other Supine Lumbar Exercises supine on foam roll for decompression x 4 minutes   Shoulder Exercises: Supine   Horizontal ABduction Strengthening;20 reps;Theraband   Theraband Level (Shoulder Horizontal ABduction) Level 3 (Green)   Other Supine Exercises D2 with green band 2x10 bil. performed on foam roll   Shoulder Exercises: Seated   Flexion 20 reps;Weights;Strengthening;Both   Flexion Weight (lbs) 1   Abduction Both;20 reps;Weights;Strengthening  also scaption   ABduction Weight (lbs) 1   Shoulder Exercises: ROM/Strengthening   UBE (Upper Arm Bike) Level 1x 6 minutes (3/3)  verbal cues for posture   Modalities   Modalities Electrical Stimulation   Moist  Heat Therapy   Number Minutes Moist Heat 15 Minutes   Moist Heat Location Lumbar Spine   Electrical Stimulation   Electrical Stimulation Location R lumbar spine   Electrical Stimulation Action IFC   Electrical Stimulation Parameters 15 minutes   Electrical Stimulation Goals Pain   Manual Therapy   Manual Therapy Joint mobilization;Soft tissue mobilization   Manual therapy comments PA glides to T3-L1 grade 3   Soft tissue mobilization to Rt QL region and use of orange roller ball for tissue mobilization                  PT Short Term Goals -  11/29/15 1407    PT SHORT TERM GOAL #1   Title be independent in initial HEP   Status Achieved   PT SHORT TERM GOAL #2   Title report a 25% reduction in LBP with ADLs and self-care   Status Achieved   PT SHORT TERM GOAL #3   Title demonstrate correct body mechanics with home tasks to protect lumbar spine.   Status Achieved           PT Long Term Goals - 12/14/15 1411    PT LONG TERM GOAL #1   Title be independent in advanced HEP   Time 8   Period Weeks   Status Achieved   PT LONG TERM GOAL #2   Title reduce FOTO to < or = to 41% limitation   Status Partially Met  44%    PT LONG TERM GOAL #3   Title report a 60% reduction in LBP/thoracic pain with ADLs and self-care   Status Achieved               Plan - 12/14/15 1418    Clinical Impression Statement Pt reports 75% overall improvement since the start of care.  Pt is independent in HEP and postural corrections.  Pt with continued chronic stiffness and flexibility deficits in the thoracic spine.  Pt will discharge to HEP for further expected gains.     PT Next Visit Plan D/C PT        Problem List There are no active problems to display for this patient.  PHYSICAL THERAPY DISCHARGE SUMMARY  Visits from Start of Care: 6  Current functional level related to goals / functional outcomes: See above for current status.    Remaining deficits: See above.   Education / Equipment: Body mechanics and HEP Plan: Patient agrees to discharge.  Patient goals were partially met. Patient is being discharged due to being pleased with the current functional level.  ?????     Karlyn Glasco, PT 12/14/2015, 2:42 PM  [facility] [address] [place], [place], [address] Phone: [phone]   Fax:  [phone]  Name: OMKAR STRATMANN MRN: [id number] Date of Birth: December 16, 1960

## 2016-03-06 ENCOUNTER — Encounter (HOSPITAL_COMMUNITY): Payer: Self-pay | Admitting: *Deleted

## 2016-03-06 ENCOUNTER — Emergency Department (HOSPITAL_COMMUNITY)
Admission: EM | Admit: 2016-03-06 | Discharge: 2016-03-06 | Disposition: A | Discharge status: Home / Self Care | Payer: 59 | Attending: Emergency Medicine | Admitting: Emergency Medicine

## 2016-03-06 DIAGNOSIS — E785 Hyperlipidemia, unspecified: Secondary | ICD-10-CM | POA: Diagnosis not present

## 2016-03-06 DIAGNOSIS — Z79899 Other long term (current) drug therapy: Secondary | ICD-10-CM | POA: Insufficient documentation

## 2016-03-06 DIAGNOSIS — Z880 Allergy status to penicillin: Secondary | ICD-10-CM | POA: Diagnosis not present

## 2016-03-06 DIAGNOSIS — R040 Epistaxis: Secondary | ICD-10-CM | POA: Diagnosis not present

## 2016-03-06 DIAGNOSIS — K219 Gastro-esophageal reflux disease without esophagitis: Secondary | ICD-10-CM | POA: Diagnosis not present

## 2016-03-06 DIAGNOSIS — F329 Major depressive disorder, single episode, unspecified: Secondary | ICD-10-CM | POA: Insufficient documentation

## 2016-03-06 MED ORDER — SILVER NITRATE-POT NITRATE 75-25 % EX MISC
1.0000 | Freq: Once | CUTANEOUS | Status: AC
  Administered 2016-03-06: 1 via TOPICAL
  Filled 2016-03-06: qty 1

## 2016-03-06 NOTE — ED Provider Notes (Signed)
CSN: [id number]     Arrival date & time 03/06/16  0847 History  By signing my name below, I, Ryan Phelps, attest that this documentation has been prepared under the direction and in the presence of Charlestine Night, PA-C Electronically Signed: Charline Bills, ED Scribe 03/06/2016 at 10:09 AM   Chief Complaint  Patient presents with  . Epistaxis   The history is provided by the patient. No language interpreter was used.    HPI Comments: Ryan Phelps is a 55 y.o. male who presents to the Emergency Department complaining of epistaxis onset around 10 PM last night. Pt states that epistaxis started again this morning. He denies anticoagulant use. He also denies HA, blurred vision, nasal congestion, sore throat. No nasal spray use.   Past Medical History  Diagnosis Date  . Depression   . GERD (gastroesophageal reflux disease)   . Hyperlipidemia   . Allergy    History reviewed. No pertinent past surgical history. No family history on file. Social History  Substance Use Topics  . Smoking status: Never Smoker   . Smokeless tobacco: Never Used  . Alcohol Use: No    Review of Systems  A complete 10 system review of systems was obtained and all systems are negative except as noted in the HPI and PMH.   Allergies  Penicillins  Home Medications   Prior to Admission medications   Medication Sig Start Date End Date Taking? Authorizing Provider  ALPRAZolam Prudy Feeler) 0.5 MG tablet Take 0.5 mg by mouth at bedtime as needed for anxiety.    Historical Provider, MD  ARIPiprazole (ABILIFY) 15 MG tablet Take 15 mg by mouth daily.    Historical Provider, MD  buPROPion (WELLBUTRIN SR) 150 MG 12 hr tablet Take 150 mg by mouth 2 (two) times daily.    Historical Provider, MD  cetirizine (ZYRTEC) 10 MG chewable tablet Chew 10 mg by mouth daily.    Historical Provider, MD  divalproex (DEPAKOTE) 500 MG DR tablet Take 500 mg by mouth 3 (three) times daily.    Historical Provider, MD  escitalopram  (LEXAPRO) 20 MG tablet Take 20 mg by mouth daily.    Historical Provider, MD  Melatonin 10 MG CAPS Take by mouth.    Historical Provider, MD  methylphenidate (RITALIN LA) 10 MG 24 hr capsule Take 10 mg by mouth daily.    Historical Provider, MD  naproxen sodium (ANAPROX) 220 MG tablet Take 220 mg by mouth 2 (two) times daily with a meal.    Historical Provider, MD  pravastatin (PRAVACHOL) 20 MG tablet Take 20 mg by mouth daily.    Historical Provider, MD  testosterone (ANDROGEL) 50 MG/5GM (1%) GEL Place 5 g onto the skin daily.    Historical Provider, MD  zolpidem (AMBIEN) 10 MG tablet Take 10 mg by mouth at bedtime as needed for sleep.    Historical Provider, MD   BP 127/77 mmHg  Pulse 74  Temp(Src) 97.9 F (36.6 C) (Oral)  Resp 16  Ht  (1.854 m)  Wt 240 lb (108.863 kg)  BMI 31.67 kg/m2  SpO2 95% Physical Exam  Constitutional: He is oriented to person, place, and time. He appears well-developed and well-nourished. No distress.  HENT:  Head: Atraumatic.  Nose: Area of anterior bleeding that was clotted over.   Eyes: Conjunctivae and EOM are normal.  Neck: Neck supple. No tracheal deviation present.  Cardiovascular: Normal rate.   Pulmonary/Chest: Effort normal. No respiratory distress.  Musculoskeletal: Normal range of motion.  Neurological: He is alert and oriented to person, place, and time.  Skin: Skin is warm and dry.  Psychiatric: He has a normal mood and affect. His behavior is normal.  Nursing note and vitals reviewed.  ED Course  .Epistaxis Management Date/Time: 03/06/2016 10:25 AM Performed by: Charlestine NightLAWYER, Icholas Irby Authorized by: Charlestine NightLAWYER, Norelle Runnion Consent: Verbal consent obtained. Written consent not obtained. Risks and benefits: risks, benefits and alternatives were discussed Consent given by: patient Patient understanding: patient states understanding of the procedure being performed Patient consent: the patient's understanding of the procedure matches consent  given Procedure consent: procedure consent matches procedure scheduled Patient identity confirmed: verbally with patient and arm band Time out: Immediately prior to procedure a "time out" was called to verify the correct patient, procedure, equipment, support staff and site/side marked as required. Patient sedated: no Treatment site: right anterior Repair method: silver nitrate and cophenylcaine Post-procedure assessment: bleeding stopped Treatment complexity: simple Patient tolerance: Patient tolerated the procedure well with no immediate complications   (including critical care time) DIAGNOSTIC STUDIES: Oxygen Saturation is 95% on RA, normal by my interpretation.    COORDINATION OF CARE: 9:33 AM-Discussed treatment plan with pt at bedside and pt agreed to plan.   Labs Review Labs Reviewed - No data to display  Imaging Review No results found.   Patient is stable at this time.  There is no further bleeding.  He was observed for 30 minutes after the procedure.  The patient is advised to return here as needed.  Also advised follow-up with his doctor advised him not to blow his nose.  Rub his nose vigorously, or any other action that might dislodge the area that was treated with silver nitrate.  Patient was rechecked and there is no further bleeding  Charlestine NightChristopher Nakeisha Greenhouse, PA-C 03/06/16 1028  Derwood KaplanAnkit Nanavati, MD 03/07/16 [phone]

## 2016-03-06 NOTE — ED Notes (Signed)
Pt c/o nose bleed onset last night, pt denies taking blood thinners, pt does not have active bleeding at this time, pt denies injury to the area, A&O x4

## 2016-03-06 NOTE — Discharge Instructions (Signed)
Avoid blowing your nose or rubbing it vigorously.  Return here as needed.  Follow-up with your primary care doctor

## 2016-08-16 ENCOUNTER — Encounter: Payer: 59 | Attending: Family Medicine | Admitting: Dietician

## 2016-08-16 DIAGNOSIS — Z713 Dietary counseling and surveillance: Secondary | ICD-10-CM | POA: Insufficient documentation

## 2016-08-16 DIAGNOSIS — R7303 Prediabetes: Secondary | ICD-10-CM | POA: Diagnosis not present

## 2016-08-16 NOTE — Progress Notes (Signed)
  Medical Nutrition Therapy:  Appt start time: 0930 end time:  1015.   Assessment:  Primary concerns today: Ryan Phelps is here today is here today since he has prediabetes. He is not sure sure of the number. He was told that blood sugar was going up over the past year. Has been trying to cutting back on snacks but portion sizes are big. Eats 3 meals a day, snack in the afternoon, and snack in the middle of the night.  He is not working and lives his wife who does the food shopping and meal preparation at home. Eats out about one time per week.   Was doing exercise in the spring but got out of the habit over the summer. Started back to walking 3 x week and going gym.   Doesn't like a lot of vegetables but does like corn, spinach, french cut green beans, salad, tomatoes  Preferred Learning Style:   No preference indicated   Learning Readiness:   Ready  MEDICATIONS: see list   DIETARY INTAKE:  Usual eating pattern includes 3 meals and 2 snacks per day.  Avoided foods include vegetables    24-hr recall:  B ( AM): cheerios or wheat chex with banana and 1% milk   Snk ( AM): none  L ( PM): sandwich with whole grain bread and [place]urkey and mustard or salad with tuna Snk ( PM): fruit or peanut butter D ( PM): chicken tetrazini, vegetable stew with kielbasa, orange chicken in crock pot Snk (middle of the night): peanut butter or cheese crackers and 1-2 activia yogurt Beverages: tea with stevia, 3 glasses fruit juice, milk at bedtime  Usual physical activity: very little   Estimated energy needs: 2000 calories 225 g carbohydrates 150 g protein 56 g fat  Progress Towards Goal(s):  In progress.   Nutritional Diagnosis:  NB-1.1 Food and nutrition-related knowledge deficit As related to hx of prediabetes.  As evidenced by excess consumption of carbohydrate foods.    Intervention:  Nutrition counseling provided. Plan: Try to add fresh or frozen vegetables that you like to lunch and dinner  meals (spinach, salad, tomato, green beans). And if you want seconds, have seconds on vegetables first. Have protein the size of the palm of your hand. Have starch/fruit about the amount you can hold in your hand (about 1 cup). Keep portion size of cereal to about 1 cup and save banana for a snack. If you are still hungry in the morning add protein (egg). Have protein with carbs for snacks (peanut butter with fruit, 1 yogurt, cheese/peanut crackers) Portion out snacks for night before bed.  Drink water or tea with stevia instead of juice or try diet Ocean Spray or V-8 Splash. Aim to get 150 minutes or more of exercise each week.    Teaching Method Utilized:  Visual Auditory Hands on  Handouts given during visit include:  Living Well With Diabetes  MyPlate Handout  15 g CHO Snacks   Barriers to learning/adherence to lifestyle change: none  Demonstrated degree of understanding via:  Teach Back   Monitoring/Evaluation:  Dietary intake, exercise, and body weight prn.

## 2016-08-16 NOTE — Patient Instructions (Addendum)
Try to add fresh or frozen vegetables that you like to lunch and dinner meals (spinach, salad, tomato, green beans). And if you want seconds, have seconds on vegetables first. Have protein the size of the palm of your hand. Have starch/fruit about the amount you can hold in your hand (about 1 cup). Keep portion size of cereal to about 1 cup and save banana for a snack. If you are still hungry in the morning add protein (egg). Have protein with carbs for snacks (peanut butter with fruit, 1 yogurt, cheese/peanut crackers) Portion out snacks for night before bed.  Drink water or tea with stevia instead of juice or try diet Ocean Spray or V-8 Splash. Aim to get 150 minutes or more of exercise each week.

## 2017-11-25 ENCOUNTER — Emergency Department (HOSPITAL_COMMUNITY)
Admission: EM | Admit: 2017-11-25 | Discharge: 2017-11-26 | Disposition: A | Discharge status: Home / Self Care | Payer: 59 | Attending: Emergency Medicine | Admitting: Emergency Medicine

## 2017-11-25 ENCOUNTER — Emergency Department (HOSPITAL_COMMUNITY): Payer: 59

## 2017-11-25 ENCOUNTER — Other Ambulatory Visit: Payer: Self-pay

## 2017-11-25 ENCOUNTER — Encounter (HOSPITAL_COMMUNITY): Payer: Self-pay | Admitting: Emergency Medicine

## 2017-11-25 DIAGNOSIS — Y9301 Activity, walking, marching and hiking: Secondary | ICD-10-CM | POA: Diagnosis not present

## 2017-11-25 DIAGNOSIS — M546 Pain in thoracic spine: Secondary | ICD-10-CM | POA: Diagnosis not present

## 2017-11-25 DIAGNOSIS — Z79899 Other long term (current) drug therapy: Secondary | ICD-10-CM | POA: Diagnosis not present

## 2017-11-25 DIAGNOSIS — Y92008 Other place in unspecified non-institutional (private) residence as the place of occurrence of the external cause: Secondary | ICD-10-CM | POA: Diagnosis not present

## 2017-11-25 DIAGNOSIS — Y999 Unspecified external cause status: Secondary | ICD-10-CM | POA: Diagnosis not present

## 2017-11-25 DIAGNOSIS — W19XXXA Unspecified fall, initial encounter: Secondary | ICD-10-CM

## 2017-11-25 DIAGNOSIS — S098XXA Other specified injuries of head, initial encounter: Secondary | ICD-10-CM | POA: Diagnosis present

## 2017-11-25 DIAGNOSIS — W010XXA Fall on same level from slipping, tripping and stumbling without subsequent striking against object, initial encounter: Secondary | ICD-10-CM | POA: Diagnosis not present

## 2017-11-25 DIAGNOSIS — M542 Cervicalgia: Secondary | ICD-10-CM | POA: Diagnosis not present

## 2017-11-25 DIAGNOSIS — S0990XA Unspecified injury of head, initial encounter: Secondary | ICD-10-CM

## 2017-11-25 MED ORDER — OXYCODONE-ACETAMINOPHEN 5-325 MG PO TABS
1.0000 | ORAL_TABLET | Freq: Once | ORAL | Status: AC
  Administered 2017-11-26: 1 via ORAL
  Filled 2017-11-25: qty 1

## 2017-11-25 MED ORDER — METHOCARBAMOL 500 MG PO TABS
500.0000 mg | ORAL_TABLET | Freq: Once | ORAL | Status: AC
  Administered 2017-11-26: 500 mg via ORAL
  Filled 2017-11-25: qty 1

## 2017-11-25 NOTE — ED Provider Notes (Signed)
[facility] EMERGENCY DEPARTMENT Provider Note   CSN: [id number] Arrival date & time: 11/25/17  1949     History   Chief Complaint Chief Complaint  Patient presents with  . Fall    HPI  Ryan Phelps is a 56 y.o. Male with a history of depression, GERD and HLD, presents complaining of back pain after mechanical fall earlier today.  Patient reports he was walking down a wooden ramp at his house and it was slid from the rain and he slipped and fell flat on his back, reports he also hit his head and is complaining of neck pain.  Patient denies loss of consciousness, vision changes or dizziness, but reports persistent headache since the fall.  Patient also reports his neck is hurting and he is unable to turn his head completely in both directions.  Patient reports pain is worse throughout his thoracic spine at the midline and radiating out to the sides.  Patient reports he took some ibuprofen prior to arrival with minimal improvement has not taken anything else for pain.  Patient denies any numbness, tingling or weakness of extremities no loss of control of bowel or bladder.  Patient denies any chest pain or shortness of breath, no abdominal pain, dysuria or hematuria.  Patient has been ambulatory since the accident with some pain.  No nausea or vomiting.      Past Medical History:  Diagnosis Date  . Allergy   . Depression   . GERD (gastroesophageal reflux disease)   . Hyperlipidemia     There are no active problems to display for this patient.   History reviewed. No pertinent surgical history.     Home Medications    Prior to Admission medications   Medication Sig Start Date End Date Taking? Authorizing Provider  ALPRAZolam Prudy Feeler) 0.5 MG tablet Take 0.5 mg by mouth at bedtime as needed for anxiety.    [provider]  ARIPiprazole (ABILIFY) 15 MG tablet Take 15 mg by mouth daily.    [provider]  buPROPion (WELLBUTRIN SR) 150 MG 12  hr tablet Take 150 mg by mouth 2 (two) times daily.    [provider]  cetirizine (ZYRTEC) 10 MG chewable tablet Chew 10 mg by mouth daily.    [provider]  divalproex (DEPAKOTE) 500 MG DR tablet Take 500 mg by mouth 3 (three) times daily.    [provider]  escitalopram (LEXAPRO) 20 MG tablet Take 20 mg by mouth daily.    [provider]  Melatonin 10 MG CAPS Take by mouth.    [provider]  methylphenidate (RITALIN LA) 10 MG 24 hr capsule Take 10 mg by mouth daily.    [provider]  naproxen sodium (ANAPROX) 220 MG tablet Take 220 mg by mouth 2 (two) times daily with a meal.    [provider]  pravastatin (PRAVACHOL) 20 MG tablet Take 20 mg by mouth daily.    [provider]  testosterone (ANDROGEL) 50 MG/5GM (1%) GEL Place 5 g onto the skin daily.    [provider]  zolpidem (AMBIEN) 10 MG tablet Take 10 mg by mouth at bedtime as needed for sleep.    [provider]    Family History No family history on file.  Social History Social History   Tobacco Use  . Smoking status: Never Smoker  . Smokeless tobacco: Never Used  Substance Use Topics  . Alcohol use: No  . Drug use:  No     Allergies   Penicillins   Review of Systems Review of Systems  Constitutional: Negative for chills and fever.  HENT: Negative for ear discharge and ear pain.   Eyes: Negative for photophobia, pain and visual disturbance.  Respiratory: Negative for shortness of breath.   Cardiovascular: Negative for chest pain.  Gastrointestinal: Negative for abdominal pain, constipation, diarrhea, nausea and vomiting.  Genitourinary: Negative for flank pain and hematuria.  Musculoskeletal: Positive for back pain, myalgias, neck pain and neck stiffness.  Skin: Negative for wound.  Neurological: Positive for headaches. Negative for dizziness, weakness, light-headedness and numbness.     Physical Exam Updated  Vital Signs BP 123/74 (BP Location: Left Arm)   Pulse 74   Temp 98.2 F (36.8 C) (Oral)   Resp 18   Ht 6\' 1"  (1.854 m)   Wt 106.6 kg (235 lb)   SpO2 95%   BMI 31.00 kg/m   Physical Exam  Constitutional: He is oriented to person, place, and time. He appears well-developed and well-nourished. No distress.  HENT:  Head: Normocephalic.  No palpable hematoma, some tenderness over the back of the head, no battle sign suggestive of basilar skull fracture  Eyes: EOM are normal. Pupils are equal, round, and reactive to light. Right eye exhibits no discharge. Left eye exhibits no discharge.  No nystagmus  Neck:  C-spine tenderness to palpation at midline and paraspinally, range of motion limited in all directions by pain, no palpable deformity or crepitus  Cardiovascular: Normal rate, regular rhythm, normal heart sounds and intact distal pulses.  Pulses:      Radial pulses are 2+ on the right side, and 2+ on the left side.       Dorsalis pedis pulses are 2+ on the right side, and 2+ on the left side.  Pulmonary/Chest: Effort normal and breath sounds normal. No stridor. No respiratory distress. He has no wheezes. He has no rales. He exhibits no tenderness.  Abdominal: Soft. Bowel sounds are normal. He exhibits no distension. There is no tenderness. There is no guarding.  Musculoskeletal:  T-spine tender to palpation at midline and paraspinally extending bilaterally across both sides of the thoracic spine, L-spine nontender palpation at midline or paraspinally All compartments soft in all joints supple and easily movable, no obvious deformity to extremities  Neurological: He is alert and oriented to person, place, and time. Coordination normal.  Speech is clear, able to follow commands CN III-XII intact Normal strength in upper and lower extremities bilaterally including dorsiflexion and plantar flexion, strong and equal grip strength Sensation normal to light and sharp touch Moves extremities  without ataxia, coordination intact  Skin: Skin is warm and dry. Capillary refill takes less than 2 seconds. He is not diaphoretic.  Psychiatric: He has a normal mood and affect. His behavior is normal.  Nursing note and vitals reviewed.    ED Treatments / Results  Labs (all labs ordered are listed, but only abnormal results are displayed) Labs Reviewed - No data to display  EKG  EKG Interpretation None       Radiology Dg Thoracic Spine 2 View  Result Date: 11/25/2017 CLINICAL DATA:  Upper back pain after fall today. EXAM: THORACIC SPINE 2 VIEWS COMPARISON:  02/22/2014 FINDINGS: There is no evidence of thoracic spine fracture. Alignment is normal. Twelve rib-bearing thoracic vertebrae. Mild multilevel disc space narrowing with osteophyte formation along the thoracic spine appear stable. No other significant bone abnormalities are identified. IMPRESSION: Stable mild thoracic spondylosis.  No acute osseous abnormality Electronically Signed   By: Tollie Ethavid  Kwon M.D.   On: 11/25/2017 20:28   Ct Head Wo Contrast  Result Date: 11/26/2017 CLINICAL DATA:  Slip and fall injury, striking the head. Complains of headache and neck pain. Patient appears wall bleed. EXAM: CT HEAD WITHOUT CONTRAST CT CERVICAL SPINE WITHOUT CONTRAST TECHNIQUE: Multidetector CT imaging of the head and cervical spine was performed following the standard protocol without intravenous contrast. Multiplanar CT image reconstructions of the cervical spine were also generated. COMPARISON:  None. FINDINGS: CT HEAD FINDINGS Brain: Mild diffuse cerebral atrophy. No ventricular dilatation. No mass effect or midline shift. No abnormal extra-axial fluid collections. The gray-white matter junctions are distinct. Basal cisterns are not effaced. No acute intracranial hemorrhage. Vascular: No hyperdense vessel or unexpected calcification. Skull: Normal. Negative for fracture or focal lesion. Sinuses/Orbits: There is opacification of a left  ethmoid air cell, likely inflammatory. No acute air-fluid levels in the paranasal sinuses. Mastoid air cells are not opacified. Other: None. CT CERVICAL SPINE FINDINGS Alignment: Normal. Skull base and vertebrae: No acute fracture. No primary bone lesion or focal pathologic process. Soft tissues and spinal canal: No prevertebral fluid or swelling. No visible canal hematoma. Disc levels: Degenerative changes throughout the cervical spine with narrowed interspaces and associated endplate hypertrophic changes. Degenerative changes are most prominent at C4-5, C5-6, and C6-7 levels. Upper chest: Lung apices are clear. Other: None. IMPRESSION: 1. No acute intracranial abnormalities. Mild diffuse cerebral atrophy. 2. Normal alignment of the cervical spine. No acute displaced fractures identified. Electronically Signed   By: Burman NievesWilliam  Stevens M.D.   On: 11/26/2017 00:32   Ct Cervical Spine Wo Contrast  Result Date: 11/26/2017 CLINICAL DATA:  Slip and fall injury, striking the head. Complains of headache and neck pain. Patient appears wall bleed. EXAM: CT HEAD WITHOUT CONTRAST CT CERVICAL SPINE WITHOUT CONTRAST TECHNIQUE: Multidetector CT imaging of the head and cervical spine was performed following the standard protocol without intravenous contrast. Multiplanar CT image reconstructions of the cervical spine were also generated. COMPARISON:  None. FINDINGS: CT HEAD FINDINGS Brain: Mild diffuse cerebral atrophy. No ventricular dilatation. No mass effect or midline shift. No abnormal extra-axial fluid collections. The gray-white matter junctions are distinct. Basal cisterns are not effaced. No acute intracranial hemorrhage. Vascular: No hyperdense vessel or unexpected calcification. Skull: Normal. Negative for fracture or focal lesion. Sinuses/Orbits: There is opacification of a left ethmoid air cell, likely inflammatory. No acute air-fluid levels in the paranasal sinuses. Mastoid air cells are not opacified. Other: None.  CT CERVICAL SPINE FINDINGS Alignment: Normal. Skull base and vertebrae: No acute fracture. No primary bone lesion or focal pathologic process. Soft tissues and spinal canal: No prevertebral fluid or swelling. No visible canal hematoma. Disc levels: Degenerative changes throughout the cervical spine with narrowed interspaces and associated endplate hypertrophic changes. Degenerative changes are most prominent at C4-5, C5-6, and C6-7 levels. Upper chest: Lung apices are clear. Other: None. IMPRESSION: 1. No acute intracranial abnormalities. Mild diffuse cerebral atrophy. 2. Normal alignment of the cervical spine. No acute displaced fractures identified. Electronically Signed   By: Burman NievesWilliam  Stevens M.D.   On: 11/26/2017 00:32    Procedures Procedures (including critical care time)  Medications Ordered in ED Medications  methocarbamol (ROBAXIN) tablet 500 mg (not administered)  oxyCODONE-acetaminophen (PERCOCET/ROXICET) 5-325 MG per tablet 1 tablet (not administered)     Initial Impression / Assessment and Plan / ED Course  I have reviewed the triage vital signs and the nursing notes.  Pertinent labs & imaging results that were available during my care of the patient were reviewed by me and considered in my medical decision making (see chart for details).  Presents complaining of head, neck and thoracic back pain after mechanical fall today.  Denies loss of consciousness, no neurologic deficits on exam, no extremity numbness, tingling or weakness, no concern for cauda equina, but patient complaining of persistent headache, C-spine unable to be cleared Via Nexus criteria given midline tenderness and decreased range of motion, will get noncontrasted head and cervical spine CT. thoracic spine x-ray obtained from triage, shows no evidence of fracture or traumatic malalignment, tenderness likely due to muscle strain.  CT head and neck show no evidence of acute intracranial abnormalities, fracture or  traumatic malalignment.  Patient reports improvement in pain after medication here in the ED.  Patient stable for discharge home with NSAIDs, Tylenol and muscle relaxers for pain.  Strict return precautions discussed.  Patient to follow-up with primary care doctor next week.  Counseled on typical course of pain, encouraged to use ice and heat as well. Pt expresses understanding and agrees with plan.   Final Clinical Impressions(s) / ED Diagnoses   Final diagnoses:  Fall, initial encounter  Minor head injury, initial encounter  Neck pain  Acute bilateral thoracic back pain    ED Discharge Orders        Ordered    methocarbamol (ROBAXIN) 500 MG tablet  2 times daily     11/26/17 0045       Dartha LodgeFord, Raider Valbuena N, PA-C 11/26/17 0054    Palumbo, April, MD 11/26/17 [phone]

## 2017-11-25 NOTE — ED Triage Notes (Addendum)
Pt reports fall on "slick wood" about two hours ago, states knees came out from under him. Concerned for "spinal damage to upper spine." C/O back pain and head pain. Denies tingling/numbness/loss of control of bowel and bladder.

## 2017-11-26 ENCOUNTER — Emergency Department (HOSPITAL_COMMUNITY): Payer: 59

## 2017-11-26 MED ORDER — METHOCARBAMOL 500 MG PO TABS: 500.0000 mg | ORAL_TABLET | Freq: Two times a day (BID) | ORAL | 0 refills | Status: DC

## 2017-11-26 NOTE — Discharge Instructions (Signed)
Your evaluation and imaging is reassuring, no evidence of acute fractures or malalignment of the spine, continue to treat pain with NSAIDs, Tylenol and muscle relaxers as needed.  It easy and avoid strenuous activity for the next 2 days, start to slowly incorporate light stretching, you may also use ice or heat to help with pain.  If symptoms are not improving please follow-up with your primary doctor.  If you have significantly worsening pain, numbness, tingling or weakness in your extremities, severe headache or other concerning symptoms please return to the emergency department for reevaluation

## 2017-11-26 NOTE — ED Notes (Signed)
ED Provider at bedside. 

## 2017-11-26 NOTE — ED Notes (Signed)
Patient transported to CT 

## 2019-07-09 ENCOUNTER — Ambulatory Visit: Payer: 59 | Admitting: Dietician

## 2019-07-29 ENCOUNTER — Encounter: Payer: Self-pay | Admitting: Registered"

## 2019-07-29 ENCOUNTER — Other Ambulatory Visit: Payer: Self-pay

## 2019-07-29 ENCOUNTER — Encounter: Payer: 59 | Attending: Family Medicine | Admitting: Registered"

## 2019-07-29 DIAGNOSIS — E119 Type 2 diabetes mellitus without complications: Secondary | ICD-10-CM | POA: Diagnosis present

## 2019-07-29 NOTE — Progress Notes (Signed)
Diabetes Self-Management Education  Visit Type: First/Initial  Appt. Start Time: 1025     Appt. End Time: 1159  08/04/2019  [name], identified by name and date of birth, is a 58 y.o. male with a diagnosis of Diabetes: Type 2.   ASSESSMENT  There were no vitals taken for this visit. There is no height or weight on file to calculate BMI.   Pt present alone for appointment. Pt reports he would like help with increasing exercise and would like to know about the physical effects of diabetes. Last hgbA1c was 6.5 on 06/01/19. Pt reports he is not currently checking blood sugar at home. He reports he was told he would be taught how to check blood sugar today. Pt reports he does not like needles. Pt reports having low testosterone which causes low energy level. Reports he currently has to give himself testosterone shots. Pt is open to checking blood sugar. Pt does not currently have a glucose meter. Pt reports he has bipolar type B.   Diabetes Self-Management Education - 07/29/19 1040      Visit Information   Visit Type  First/Initial      Initial Visit   Diabetes Type  Type 2    Are you currently following a meal plan?  No    Are you taking your medications as prescribed?  --   Not on diabetes medications currently.   Date Diagnosed  July 2020      Health Coping   How would you rate your overall health?  Fair;Poor      Psychosocial Assessment   Patient Belief/Attitude about Diabetes  Denial    Self-care barriers  Unable to determine    Self-management support  Family    Other persons present  Patient    Patient Concerns  Nutrition/Meal planning;Healthy Lifestyle;Other (comment)   Pathophysiology   Special Needs  None    Preferred Learning Style  No preference indicated    Learning Readiness  Ready    How often do you need to have someone help you when you read instructions, pamphlets, or other written materials from your doctor or pharmacy?  1 - Never    What is the last  grade level you completed in school?  Graduate School.      Pre-Education Assessment   Patient understands the diabetes disease and treatment process.  Needs Instruction    Patient understands incorporating nutritional management into lifestyle.  Needs Instruction    Patient undertands incorporating physical activity into lifestyle.  Needs Instruction    Patient understands using medications safely.  Demonstrates understanding / competency    Patient understands monitoring blood glucose, interpreting and using results  Needs Instruction    Patient understands prevention, detection, and treatment of acute complications.  Needs Instruction    Patient understands prevention, detection, and treatment of chronic complications.  Needs Instruction    Patient understands how to develop strategies to address psychosocial issues.  Needs Instruction    Patient understands how to develop strategies to promote health/change behavior.  Needs Instruction      Complications   Last HgB A1C per patient/outside source  6.5 %   06/01/19   How often do you check your blood sugar?  0 times/day (not testing)    Fasting Blood glucose range (mg/dL)  --   Pt not currently checking blood sugar.   Postprandial Blood glucose range (mg/dL)  --   Pt not currently checking blood sugar.   Number of hypoglycemic  episodes per month  --   Pt is not checking blood sugar but denies having any symptoms.   Number of hyperglycemic episodes per week  --   Pt is not checking blood sugar.   Have you had a dilated eye exam in the past 12 months?  Yes    Have you had a dental exam in the past 12 months?  Yes    Are you checking your feet?  No      Dietary Intake   Breakfast  9 AM: [place] yogurt with blended fruit    Snack (morning)  None reported    Lunch  None reported    Snack (afternoon)  None reported    Dinner  630 PM: chicken and spaghetti casserole, spring salad with light ranch dressing, small bowl of mixed fruit, tea  sweetened with stevia    Snack (evening)  1 piece of strawberry cake with white icing    Beverage(s)  tea sweetened with stevia; usually keeps a bottle of [employer] juice in the refrigerator to drink on during the day.      Exercise   Exercise Type  ADL's    How many days per week to you exercise?  0    How many minutes per day do you exercise?  0    Total minutes per week of exercise  0      Patient Education   Previous Diabetes Education  No   Prediabetes education in the past before dx of diabetes   Disease state   Definition of diabetes, type 1 and 2, and the diagnosis of diabetes;Factors that contribute to the development of diabetes    Nutrition management   Role of diet in the treatment of diabetes and the relationship between the three main macronutrients and blood glucose level;Food label reading, portion sizes and measuring food.;Carbohydrate counting    Physical activity and exercise   Role of exercise on diabetes management, blood pressure control and cardiac health.    Monitoring  Taught/evaluated SMBG meter.;Purpose and frequency of SMBG.;Taught/discussed recording of test results and interpretation of SMBG.;Interpreting lab values - A1C, lipid, urine microalbumina.;Identified appropriate SMBG and/or A1C goals.;Daily foot exams;Yearly dilated eye exam    Acute complications  Taught treatment of hypoglycemia - the 15 rule.    Chronic complications  Relationship between chronic complications and blood glucose control;Assessed and discussed foot care and prevention of foot problems;Lipid levels, blood glucose control and heart disease;Dental care;Retinopathy and reason for yearly dilated eye exams;Nephropathy, what it is, prevention of, the use of ACE, ARB's and early detection of through urine microalbumia.;Reviewed with patient heart disease, higher risk of, and prevention      Individualized Goals (developed by patient)   Nutrition  Follow meal plan discussed;General  guidelines for healthy choices and portions discussed    Physical Activity  --    Monitoring   test my blood glucose as discussed    Reducing Risk  examine blood glucose patterns;do foot checks daily      Post-Education Assessment   Patient understands the diabetes disease and treatment process.  Demonstrates understanding / competency    Patient understands incorporating nutritional management into lifestyle.  Demonstrates understanding / competency    Patient undertands incorporating physical activity into lifestyle.  Demonstrates understanding / competency    Patient understands using medications safely.  Demonstrates understanding / competency    Patient understands monitoring blood glucose, interpreting and using results  Needs Review   Pt reports he  will need to get a monitor and check for himself to see if he has additonal questions   Patient understands prevention, detection, and treatment of acute complications.  Demonstrates understanding / competency    Patient understands prevention, detection, and treatment of chronic complications.  Demonstrates understanding / competency    Patient understands how to develop strategies to address psychosocial issues.  Demonstrates understanding / competency    Patient understands how to develop strategies to promote health/change behavior.  Demonstrates understanding / competency      Outcomes   Expected Outcomes  Demonstrated interest in learning. Expect positive outcomes    Future DMSE  PRN   Pt to call and schedule f/u if he has additonal questions after receiving his glucose monitor   Program Status  Completed       Individualized Plan for Diabetes Self-Management Training:   Learning Objective:  Patient will have a greater understanding of diabetes self-management. Patient education plan is to attend individual and/or group sessions per assessed needs and concerns.   Plan:  -Dietitian provided DSME. Went through overview of SMBG.  Dietitian recommended pt check with insurance company and ask his doctor about prescription for monitor covered by insurance. Advised pt to call if any questions once he receives his specific monitor and pt can come by office to go through using his monitor if he has any questions. Pt reports he will call if he has any additional questions for f/u.   Instructions/Goals:   Have 3 meals per day/avoid going more than 5 hours without eating.    Include a consistent amount of carbohydrates at each meal. Recommend starting with 3-4 carbohydrates choices/servings per meal (45-60 g). If this is too much food or not enough, can adjust to 2-3 or 4-5 per meal but want to try to include about the same at each meal.    Recommend checking with insurance company regarding which glucose meter is covered and then asking your doctor about a referral to allow for the price of meter, strips, and lancets to be covered. If you have additional questions about checking blood sugar once you receive your meter, can call to set a time to go over it or can bring to next appointment.   Recommend checking 1 time fasting before eating or drinking in the morning and 1 time 1-2 hours after starting a meal.    Try to include more water-try for about 64 oz or 4 x 16 oz bottles of water daily. Recommend gradually working up to this amount.    Patient Instructions  Instructions/Goals:   Have 3 meals per day/avoid going more than 5 hours without eating.    Include a consistent amount of carbohydrates at each meal. Recommend starting with 3-4 carbohydrates choices/servings per meal (45-60 g). If this is too much food or not enough, can adjust to 2-3 or 4-5 per meal but want to try to include about the same at each meal.    Recommend checking with insurance company regarding which glucose meter is covered and then asking your doctor about a referral to allow for the price of meter, strips, and lancets to be covered. If you  have additional questions about checking blood sugar once you receive your meter, can call to set a time to go over it or can bring to next appointment.   Recommend checking 1 time fasting before eating or drinking in the morning and 1 time 1-2 hours after starting a meal.    Try to  include more water-try for about 64 oz or 4 x 16 oz bottles of water daily. Recommend gradually working up to this amount.    Expected Outcomes:  Demonstrated interest in learning. Expect positive outcomes  Education material provided: ADA - How to Thrive: A Guide for Your Journey with Diabetes, Meal plan card, My Plate and Snack sheet  If problems or questions, patient to contact team via:  Phone and Email  Future DSME appointment: PRN(Pt to call and schedule f/u if he has additonal questions after receiving his glucose monitor)

## 2019-07-29 NOTE — Patient Instructions (Signed)
Instructions/Goals:   Have 3 meals per day/avoid going more than 5 hours without eating.    Include a consistent amount of carbohydrates at each meal. Recommend starting with 3-4 carbohydrates choices/servings per meal (45-60 g). If this is too much food or not enough, can adjust to 2-3 or 4-5 per meal but want to try to include about the same at each meal.    Recommend checking with insurance company regarding which glucose meter is covered and then asking your doctor about a referral to allow for the price of meter, strips, and lancets to be covered. If you have additional questions about checking blood sugar once you receive your meter, can call to set a time to go over it or can bring to next appointment.   Recommend checking 1 time fasting before eating or drinking in the morning and 1 time 1-2 hours after starting a meal.    Try to include more water-try for about 64 oz or 4 x 16 oz bottles of water daily. Recommend gradually working up to this amount.

## 2019-09-07 ENCOUNTER — Other Ambulatory Visit: Payer: Self-pay | Admitting: *Deleted

## 2019-09-07 DIAGNOSIS — Z20822 Contact with and (suspected) exposure to covid-19: Secondary | ICD-10-CM

## 2019-09-08 LAB — NOVEL CORONAVIRUS, NAA: SARS-CoV-2, NAA: NOT DETECTED

## 2019-09-16 ENCOUNTER — Other Ambulatory Visit: Payer: Self-pay

## 2019-09-16 ENCOUNTER — Encounter: Payer: 59 | Attending: Family Medicine | Admitting: Registered"

## 2019-09-16 DIAGNOSIS — E119 Type 2 diabetes mellitus without complications: Secondary | ICD-10-CM | POA: Diagnosis present

## 2019-09-16 NOTE — Patient Instructions (Addendum)
Instructions/Goals:   Have 3 meals per day/avoid going more than 5 hours without eating.    Include a consistent amount of carbohydrates at each meal. Recommend starting with 3-4 carbohydrates choices/servings per meal (45-60 g). If this is too much food or not enough, can adjust to 2-3 or 4-5 per meal but want to try to include about the same at each meal.    Checking Blood Sugar:  Recommend checking 1 time fasting before eating or drinking in the morning and 1 time 1-2 hours after starting a meal.     Try to include more water-try for about 64 oz or 4 x 16 oz bottles of water daily. Recommend gradually working up to this amount.     If having symptoms of low blood sugar (pages 12-13) check blood sugar and  follow directions in booklet for 15/15 rule.

## 2019-09-16 NOTE — Progress Notes (Signed)
Diabetes Self-Management Education  Visit Type:    Appt. Start Time: 1515     Appt. End Time: 4259  09/16/2019  [name], identified by name and date of birth, is a 58 y.o. male with a diagnosis of Diabetes:  .   ASSESSMENT  Weight 225 lb 12.8 oz (102.4 kg). Body mass index is 29.79 kg/m.   Nutrition Follow-Up: Pt reports things are going pretty good but he has not been doing well on his diet. Reports he has purchased a meter but has not yet tried it out. Reports the instructions were challenging and he was not sure what to put in as his goal ranges. Pt has a Psychologist, occupational. Dietitian went through SMBG with pt's glucose meter. Pt was able to check his blood sugar during visit and reports he felt pretty good about it.  Pt has a question regarding fasting measures. Pt reports having trouble sleeping d/t bipolar medications. Reports he usually takes medication to help him sleep around 930PM and will fall asleep around that time but then will wake up again around 2AM. Reports when he wakes around 2 AM he usually is hungry and eats a snack. He then goes back to sleep and wakes up around 830 and has breakfast around 9-930 AM. Pt wants to know how this will him checking for his fasting blood sugar reading in the morning when he has not fully fasted. Pt also has questions regarding review on how many carbohydrates he should include per meal.   Pt reports he is not currently physically active. Pt reports he would like to include more physical activity but he is not yet comfortable going back to his gym [employer]).   24 hour Recall:  Breakfast 9-930 AM: [place] yogurt with fruit on the bottom (~19g CHO), 1 scrambled egg, no beverage  Morning Snack: None reported.  Lunch 10-1229: [place] sandwich on 2 pieces of whole wheat bread, bottle water  Afternoon Snack: unsure what he had. Dinner 730 PM: grilled chicken (~4 oz), spinach, broccoli, water  Evening Snack: None reported.  2  AM: bowl of Honey Nut Cheerios cereal; Adkins shake   Individualized Plan for Diabetes Self-Management Training:   Learning Objective:  Patient will have a greater understanding of diabetes self-management. Patient education plan is to attend individual and/or group sessions per assessed needs and concerns.   Plan: Dietitian went through SMBG with pt using pt's meter he brought with him to appointment. Went through whole process including proper disposal and general blood sugar goals. Dietitian reviewed carbohydrate counting and discussed goals per meal. Discussed that some of pt's current meals are very low in carbohydrates (less than 2 servings/choices) and that could be contributing to pt having cravings and being very hungry when he wakes up during the night. Discussed working to have a more consistent carbohydrate intake during the day. Covered 15/15 rule for hypoglycemia.   Instructions/Goals:   Have 3 meals per day/avoid going more than 5 hours without eating.    Include a consistent amount of carbohydrates at each meal. Recommend starting with 3-4 carbohydrates choices/servings per meal (45-60 g). If this is too much food or not enough, can adjust to 2-3 or 4-5 per meal but want to try to include about the same at each meal.    Checking Blood Sugar:  Recommend checking 1 time fasting before eating or drinking in the morning and 1 time 1-2 hours after starting a meal.     Try to  include more water-try for about 64 oz or 4 x 16 oz bottles of water daily. Recommend gradually working up to this amount.     If having symptoms of low blood sugar (pages 12-13) check blood sugar and  follow directions in booklet for 15/15 rule.    Patient Instructions  Instructions/Goals:   Have 3 meals per day/avoid going more than 5 hours without eating.    Include a consistent amount of carbohydrates at each meal. Recommend starting with 3-4 carbohydrates choices/servings per meal (45-60  g). If this is too much food or not enough, can adjust to 2-3 or 4-5 per meal but want to try to include about the same at each meal.    Checking Blood Sugar:  Recommend checking 1 time fasting before eating or drinking in the morning and 1 time 1-2 hours after starting a meal.     Try to include more water-try for about 64 oz or 4 x 16 oz bottles of water daily. Recommend gradually working up to this amount.     If having symptoms of low blood sugar (pages 12-13) check blood sugar and  follow directions in booklet for 15/15 rule.    Education material provided: [employer] foldout  If problems or questions, patient to contact team via:  Phone and Email  Future DSME appointment:  PRN-pt to call if further questions or concerns for f/u. Pt reports he feels he has information he needs at this time.

## 2020-11-26 HISTORY — PX: TOOTH EXTRACTION: SHX859

## 2020-11-28 ENCOUNTER — Encounter: Payer: Self-pay | Admitting: Physical Therapy

## 2020-11-28 ENCOUNTER — Other Ambulatory Visit: Payer: Self-pay

## 2020-11-28 ENCOUNTER — Ambulatory Visit: Payer: Medicare Other | Attending: Family Medicine | Admitting: Physical Therapy

## 2020-11-28 DIAGNOSIS — R42 Dizziness and giddiness: Secondary | ICD-10-CM

## 2020-11-28 DIAGNOSIS — R262 Difficulty in walking, not elsewhere classified: Secondary | ICD-10-CM

## 2020-11-28 DIAGNOSIS — R2681 Unsteadiness on feet: Secondary | ICD-10-CM

## 2020-11-28 DIAGNOSIS — R296 Repeated falls: Secondary | ICD-10-CM | POA: Diagnosis present

## 2020-11-28 NOTE — Therapy (Signed)
[facility] [address] [place], [place], [address] Phone: [phone]   Fax:  [phone]  Physical Therapy Evaluation  Patient Details  Name: Ryan Phelps MRN: [id number] Date of Birth: 03/02/1961 Referring Provider (PT): Shon Hale, MD   Encounter Date: 11/28/2020   PT End of Session - 11/28/20 1253    Visit Number 1    Number of Visits 7    Date for PT Re-Evaluation 01/27/21    Authorization Type Medicare A&B; 10th visit PN    PT Start Time 1152    PT Stop Time 1239    PT Time Calculation (min) 47 min    Activity Tolerance Patient tolerated treatment well    Behavior During Therapy [facility] for tasks assessed/performed           Past Medical History:  Diagnosis Date  . Allergy   . Bipolar 2 disorder (HCC)   . Depression   . GERD (gastroesophageal reflux disease)   . Hyperlipidemia   . Sleep apnea     History reviewed. No pertinent surgical history.  There were no vitals filed for this visit.    Subjective Assessment - 11/28/20 1157    Subjective Reports dizziness and ear fullness for 2 months after flying on an airplane.  If he blows his nose or closing his nose and blowing out to clear the pressure he feels spinning and off balance/unsteady.  Has fluid coming out of R ear.   Denies dizziness with bearing down.  Has referral to ENT, appointment is next week - Dr. Jenne Pane.    Diagnostic tests None    Patient Stated Goals To get rid of the dizziness when blowing his nose.    Currently in Pain? Yes    Pain Location Back    Pain Orientation Lower              [facility] PT Assessment - 11/28/20 1202      Assessment   Medical Diagnosis Dizziness    Referring Provider (PT) Shon Hale, MD    Onset Date/Surgical Date 11/14/20    Prior Therapy not for dizziness      Precautions   Precautions Other (comment)    Precaution Comments type 2 DM, depression/bipolar, OSA with CPAP, HLD,  osteoporosis, hiatal hernia, chronic LBP      Balance Screen   Has the patient fallen in the past 6 months No    Has the patient had a decrease in activity level because of a fear of falling?  No      Home [employer]ist information centre manager residence    Living Arrangements Spouse/significant other    Type of Home House      Prior Function   Level of Independence Independent      Observation/Other Assessments   Focus on Therapeutic Outcomes (FOTO)  65; DPS: 69.8      Sensation   Light Touch Appears Intact      ROM / Strength   AROM / PROM / Strength Strength      Strength   Overall Strength Within functional limits for tasks performed      Transfers   Transfers Independent with all Transfers      Ambulation/Gait   Ambulation/Gait Yes    Ambulation/Gait Assistance 7: Independent                  Vestibular Assessment - 11/28/20 1203      Vestibular Assessment  General Observation Denies changes in vision, denies changes in hearing.  Does report having aural fullness and mild tinnitus in R ear.      Symptom Behavior   Type of Dizziness  Spinning;Unsteady with head/body turns    Frequency of Dizziness intermittent    Duration of Dizziness minutes    Symptom Nature Intermittent   with blowing nose, valsalva   Aggravating Factors Comment   nose blowing and clearing pressure in ears   Relieving Factors Head stationary    Progression of Symptoms No change since onset      Oculomotor Exam   Oculomotor Alignment Normal    Ocular ROM WFL    Spontaneous Absent    Gaze-induced  Absent    Smooth Pursuits Intact    Saccades Intact      Oculomotor Exam-Fixation Suppressed    Spontaneous Nystagmus absent    Gaze evoked nystagmus absent    Left Head Impulse negative    Right Head Impulse negative    Head Shaking Nystagmus-Horizontal none observed      Vestibulo-Ocular Reflex   VOR to Slow Head Movement Normal    VOR Cancellation Normal      Other Tests    Tragal Pain on L, mild dizziness, no nystagmus noted    Comments Valsalva-moderate dizziness but no nystagmus noted      Positional Testing   Sidelying Test Sidelying Right;Sidelying Left    Horizontal Canal Testing Horizontal Canal Right;Horizontal Canal Left      Sidelying Right   Sidelying Right Duration 0    Sidelying Right Symptoms No nystagmus      Sidelying Left   Sidelying Left Duration 0    Sidelying Left Symptoms No nystagmus      Horizontal Canal Right   Horizontal Canal Right Duration 0    Horizontal Canal Right Symptoms Normal      Horizontal Canal Left   Horizontal Canal Left Duration 0    Horizontal Canal Left Symptoms Normal      Positional Sensitivities   Sit to Supine No dizziness    Supine to Left Side No dizziness    Supine to Right Side No dizziness    Supine to Sitting No dizziness    Nose to Right Knee No dizziness    Right Knee to Sitting No dizziness    Nose to Left Knee No dizziness    Left Knee to Sitting No dizziness    Head Turning x 5 No dizziness    Head Nodding x 5 Lightheadedness    Pivot Right in Standing No dizziness    Pivot Left in Standing No dizziness    Rolling Right No dizziness    Rolling Left No dizziness              Objective measurements completed on examination: See above findings.               PT Education - 11/28/20 1252    Education Details Clinical findings, PT POC will hold until after ENT visit, handout on SSCD, goals of treatment    Person(s) Educated Patient    Methods Explanation;Demonstration;Handout    Comprehension Verbalized understanding               PT Long Term Goals - 11/28/20 1254      PT LONG TERM GOAL #1   Title Pt will continue to report overall function >65% on FOTO and >70 on DPS    Baseline 65; 69.8  Time 6    Period Weeks    Status New    Target Date 01/27/21      PT LONG TERM GOAL #2   Title Pt will report 75% reduction in symptoms of dizziness and  imbalance on a daily basis    Time 6    Period Weeks    Status New    Target Date 01/27/21      PT LONG TERM GOAL #3   Title Pt will report no dizziness with repeated vertical head movements    Baseline mild dizziness with repeated looking up/down    Time 6    Period Weeks    Status New    Target Date 01/27/21                  Plan - 11/28/20 1259    Clinical Impression Statement Pt is a 60 year old male referred to Neuro OPPT for evaluation of dizziness and imbalance.  Pt's PMH is significant for the following: type 2 DM, depression/bipolar, OSA with CPAP, HLD, osteoporosis, hiatal hernia, chronic LBP. The following deficits were noted during pt's exam: pain in left ear, aural fullness, vertigo brought on with tragal pressure, vertical head movements and Valsalva maneuver.  Patient was negative for positional vertigo.  Therapist unable to provoke patient's symptoms with position changes or movement.  PT recommending that patient participate in full assessment by ENT next week due to symptoms concerning for superior semi-circular canal dehiscence and then follow up with PT to determine if he would benefit from follow up visits.  If a vestibular dysfunction is diagnosed by ENT pt would benefit from skilled PT to address these impairments and functional limitations to maximize functional mobility independence and reduce falls risk.    Personal Factors and Comorbidities Comorbidity 3+    Comorbidities type 2 DM, depression/bipolar, OSA with CPAP, HLD, osteoporosis, hiatal hernia, chronic LBP    Examination-Activity Limitations Locomotion Level    Stability/Clinical Decision Making Stable/Uncomplicated    Clinical Decision Making Low    Rehab Potential Good    PT Frequency 1x / week    PT Duration 8 weeks   but expect only 6 weeks of treatment   PT Treatment/Interventions ADLs/Self Care Home Management;Canalith Repostioning;Gait training;Functional mobility training;Therapeutic  activities;Therapeutic exercise;Balance training;Neuromuscular re-education;Patient/family education;Vestibular    PT Next Visit Plan Pt to call about ENT visit.  Superior canal dehiscence?  Schedule follow up visits?    Consulted and Agree with Plan of Care Patient           Patient will benefit from skilled therapeutic intervention in order to improve the following deficits and impairments:  Decreased balance,Difficulty walking,Dizziness  Visit Diagnosis: Dizziness and giddiness  Unsteadiness on feet  Difficulty in walking, not elsewhere classified  Repeated falls     Problem List There are no problems to display for this patient.   Rico Junker, PT, DPT 11/28/20    4:57 PM    [facility] [address] [place], [place], [address] Phone: [phone]   Fax:  [phone]  Name: Ryan Phelps MRN: [id number] Date of Birth: Oct 15, 1961

## 2021-02-14 NOTE — Progress Notes (Signed)
Assessment/Plan:   1.  Tremor  -Patient is on multiple tremor inducing medications.  His tremor, however, is likely due to Depakote, although Ritalin certainly may be contributing.  He and I discussed that adding additional medication is often not helpful for drug-induced tremor.  We discussed that we could try, however.  Ultimately, after quite a discussion, the patient decided to instead try weighted gloves and weighted fork/splints to see if that would help first.  If it does not, then we can certainly try something like primidone, although success rate can be low due to the presence of tremor inducing medications.  -The good news is is that I did not see any parkinsonism or tremor that appeared due to Abilify.  He and I discussed this in detail.  2.  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  He stated that that resolved once he was placed on Austedo.  He has been happy with the efficacy of Austedo and I certainly would recommend continuing that.  He and I talked about the fact that both Abilify and Austedo can produce parkinsonism, but again, I did not see any evidence of parkinsonism in him today.  I would recommend he continue the Austedo.  3.  Follow-up on an as-needed basis.  Subjective:   Ryan Phelps was seen today in the movement disorders clinic for neurologic consultation at the request of Milagros Evener, MD.  The consultation is for the evaluation of tremor.  Notes from Dr. Evelene Croon reviewed.  Patient has been under her care since 2019.  Notes from her indicate patient has had tremor for 8 to 10 years.  He is sent to rule out parkinsonism.   Specific Symptoms:  Tremor: Yes.  , bilateral UE, notices it in R>L but thinks that both shake equally.  Mostly shakes with activation.  Fine motor movements make it worse.  He also notes "fidgeting" with the fingers and austedo makes that better - that's been since "I have been taking my  current cocktail of meds in the 1990's."  austedo helped that and may have helped the tremor some. Family hx of similar:  No. Voice: smaller range with singing and "fuzzy" Sleep: sleeps well with cpap  Vivid Dreams:  No.  Acting out dreams:  Some sleep walk/eat Wet Pillows: Yes.   Postural symptoms: mild trouble  Falls?  No. Bradykinesia symptoms: no bradykinesia noted Loss of smell:  Yes.   , little less and thinks related to allergies Loss of taste:  No. Urinary Incontinence:  No. Difficulty Swallowing:  No. Handwriting, micrographia: Yes.   Trouble with ADL's:  No.  Trouble buttoning clothing: No. Depression:  No. , doing well with meds Memory changes:  Yes.  , trouble with names some N/V:  No. Lightheaded:  No.  Syncope: No. Diplopia:  No. Prior exposure to reglan/antipsychotics: Yes.  , Currently on aripiprazole.    Current tremor inducing medications: Aripiprazole Depakote Ritalin Austedo  Neuroimaging of the brain has not previously been performed.   PREVIOUS MEDICATIONS: aripiprazole; Depakote; Ingrezza; Austedo  ALLERGIES:   Allergies  Allergen Reactions  . Penicillins Other (See Comments)    Unknown childhood allergy    CURRENT MEDICATIONS:  Current Outpatient Medications  Medication Instructions  . ALPRAZolam (XANAX) 0.5 mg, Oral, At bedtime PRN  . ARIPiprazole (ABILIFY) 15 mg, Oral, Daily  . Austedo 12 mg, Oral, 2 times daily  . Austedo 6 mg, Oral, 2 times daily, Takes  with the 12mg  tablet  . buPROPion (WELLBUTRIN SR) 450 mg, Oral, Daily  . cetirizine (ZYRTEC) 10 mg, Oral, Daily  . divalproex (DEPAKOTE) 500 mg, Oral, 3 times daily  . escitalopram (LEXAPRO) 30 mg, Oral, Daily  . methocarbamol (ROBAXIN) 500 mg, Oral, 2 times daily  . methylphenidate (RITALIN LA) 10 mg, Oral, 2 times daily  . naproxen sodium (ALEVE) 220 mg, Oral, As needed  . NON FORMULARY CPAP machine uses nightly  . pravastatin (PRAVACHOL) 20 mg, Oral, Daily  . testosterone  cypionate (DEPOTESTOTERONE CYPIONATE) 100 MG/ML injection 0.5 mLs, Intramuscular, Weekly  . zolpidem (AMBIEN) 10 mg, Oral, At bedtime PRN    Objective:   VITALS:   Vitals:   02/15/21 0851  BP: 132/84  Pulse: 79  SpO2: 97%  Weight: 231 lb (104.8 kg)  Height: 6' (1.829 m)    GEN:  The patient appears stated age and is in NAD. HEENT:  Normocephalic, atraumatic.  The mucous membranes are moist. The superficial temporal arteries are without ropiness or tenderness. CV:  RRR Lungs:  CTAB Neck/HEME:  There are no carotid bruits bilaterally.  Neurological examination:  Orientation: The patient is alert and oriented x3.  Cranial nerves: There is good facial symmetry. Extraocular muscles are intact. The visual fields are full to confrontational testing. The speech is fluent and clear. Soft palate rises symmetrically and there is no tongue deviation. Hearing is intact to conversational tone. Sensation: Sensation is intact to light and pinprick throughout (facial, trunk, extremities). Vibration is intact at the bilateral big toe. There is no extinction with double simultaneous stimulation. There is no sensory dermatomal level identified. Motor: Strength is 5/5 in the bilateral upper and lower extremities.   Shoulder shrug is equal and symmetric.  There is no pronator drift. Deep tendon reflexes: Deep tendon reflexes are 0-1/4 at the bilateral biceps, triceps, brachioradialis, patella and achilles. Plantar responses are downgoing bilaterally.  Movement examination: Tone: There is nl tone in the bilateral upper extremities.  The tone in the lower extremities is nl.  Abnormal movements: no rest tremor.  He has min postural tremor, left greater than right.  Mild tremor with Archimedes spirals, mostly on the left.  He had evidence of tremor when pouring water from one glass to another, especially on the left. Coordination:  There is no decremation with RAM's, with any form of RAMS, including  alternating supination and pronation of the forearm, hand opening and closing, finger taps, heel taps and toe taps. Gait and Station: The patient has no difficulty arising out of a deep-seated chair without the use of the hands. The patient's stride length is good.  Patient is able to ambulate in a tandem fashion.  Total time spent on today's visit was 60 minutes, including both face-to-face time and nonface-to-face time.  Time included that spent on review of records (prior notes available to me/labs/imaging if pertinent), discussing treatment and goals, answering patient's questions and coordinating care.  Cc:  06-30-1985, MD

## 2021-02-15 ENCOUNTER — Ambulatory Visit (INDEPENDENT_AMBULATORY_CARE_PROVIDER_SITE_OTHER): Payer: Medicare Other | Admitting: Neurology

## 2021-02-15 ENCOUNTER — Other Ambulatory Visit: Payer: Self-pay

## 2021-02-15 ENCOUNTER — Encounter: Payer: Self-pay | Admitting: Neurology

## 2021-02-15 VITALS — BP 132/84 | HR 79 | Ht 72.0 in | Wt 231.0 lb

## 2021-02-15 DIAGNOSIS — G2401 Drug induced subacute dyskinesia: Secondary | ICD-10-CM

## 2021-02-15 DIAGNOSIS — G251 Drug-induced tremor: Secondary | ICD-10-CM

## 2021-02-15 NOTE — Patient Instructions (Signed)
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  We discussed that medication is often not helpful for drug induced tremor and you decided to try the weighted gloves/forks/spoons first.  If that doesn't help enough, we can try something like primidone in the future, although success rate may be low due to the tremor inducing medication.  We also discussed that you likely have some tardive dyskinesia due to the abilify and that its well controlled with austedo.  I would continue that.  Finally, we talked about the fact that I don't see any parkinsonism from the abilify and that is great news!  The physicians and staff at [facility] are committed to providing excellent care. You may receive a survey requesting feedback about your experience at our office. We strive to receive "very good" responses to the survey questions. If you feel that your experience would prevent you from giving the office a "very good " response, please contact our office to try to remedy the situation. We may be reached at [phone]. Thank you for taking the time out of your busy day to complete the survey.

## 2021-08-30 ENCOUNTER — Emergency Department (HOSPITAL_BASED_OUTPATIENT_CLINIC_OR_DEPARTMENT_OTHER)
Admission: EM | Admit: 2021-08-30 | Discharge: 2021-08-31 | Disposition: A | Discharge status: Home / Self Care | Payer: No Typology Code available for payment source | Attending: Emergency Medicine | Admitting: Emergency Medicine

## 2021-08-30 ENCOUNTER — Encounter (HOSPITAL_COMMUNITY): Payer: Self-pay | Admitting: Emergency Medicine

## 2021-08-30 ENCOUNTER — Ambulatory Visit (HOSPITAL_COMMUNITY)
Admission: EM | Admit: 2021-08-30 | Discharge: 2021-08-30 | Disposition: A | Discharge status: Home / Self Care | Payer: Medicare Other

## 2021-08-30 ENCOUNTER — Emergency Department (HOSPITAL_BASED_OUTPATIENT_CLINIC_OR_DEPARTMENT_OTHER): Payer: No Typology Code available for payment source

## 2021-08-30 ENCOUNTER — Other Ambulatory Visit: Payer: Self-pay

## 2021-08-30 ENCOUNTER — Encounter (HOSPITAL_BASED_OUTPATIENT_CLINIC_OR_DEPARTMENT_OTHER): Payer: Self-pay | Admitting: Obstetrics and Gynecology

## 2021-08-30 DIAGNOSIS — S199XXA Unspecified injury of neck, initial encounter: Secondary | ICD-10-CM | POA: Diagnosis present

## 2021-08-30 DIAGNOSIS — M546 Pain in thoracic spine: Secondary | ICD-10-CM | POA: Diagnosis not present

## 2021-08-30 DIAGNOSIS — S0990XA Unspecified injury of head, initial encounter: Secondary | ICD-10-CM | POA: Diagnosis not present

## 2021-08-30 DIAGNOSIS — Y92410 Unspecified street and highway as the place of occurrence of the external cause: Secondary | ICD-10-CM | POA: Insufficient documentation

## 2021-08-30 DIAGNOSIS — S161XXA Strain of muscle, fascia and tendon at neck level, initial encounter: Secondary | ICD-10-CM | POA: Diagnosis not present
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
## 2021-08-30 NOTE — Discharge Instructions (Signed)
Please go to the ED for further evaluation of your neck and back pain.
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
## 2021-08-31 ENCOUNTER — Emergency Department (HOSPITAL_BASED_OUTPATIENT_CLINIC_OR_DEPARTMENT_OTHER): Payer: No Typology Code available for payment source

## 2021-08-31 DIAGNOSIS — S161XXA Strain of muscle, fascia and tendon at neck level, initial encounter: Secondary | ICD-10-CM | POA: Diagnosis not present

## 2021-08-31 MED ORDER — NAPROXEN 250 MG PO TABS
500.0000 mg | ORAL_TABLET | Freq: Once | ORAL | Status: AC
  Administered 2021-08-31: 500 mg via ORAL
  Filled 2021-08-31: qty 2

## 2021-08-31 MED ORDER — NAPROXEN 375 MG PO TABS: ORAL_TABLET | ORAL | 0 refills | Status: DC

## 2021-08-31 MED ORDER — CYCLOBENZAPRINE HCL 10 MG PO TABS
10.0000 mg | ORAL_TABLET | Freq: Once | ORAL | Status: DC
  Filled 2021-08-31: qty 1

## 2021-08-31 MED ORDER — METHOCARBAMOL 500 MG PO TABS
1000.0000 mg | ORAL_TABLET | Freq: Once | ORAL | Status: AC
  Administered 2021-08-31: 1000 mg via ORAL

## 2021-08-31 MED ORDER — METHOCARBAMOL 500 MG PO TABS: 500.0000 mg | ORAL_TABLET | Freq: Four times a day (QID) | ORAL | 0 refills | Status: DC | PRN

## 2022-01-29 ENCOUNTER — Ambulatory Visit: Payer: Medicare Other | Attending: Family Medicine | Admitting: Physical Therapy

## 2022-01-29 ENCOUNTER — Other Ambulatory Visit: Payer: Self-pay

## 2022-01-29 ENCOUNTER — Encounter: Payer: Self-pay | Admitting: Physical Therapy

## 2022-01-29 DIAGNOSIS — R42 Dizziness and giddiness: Secondary | ICD-10-CM | POA: Insufficient documentation
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
## 2022-08-07 IMAGING — CT CT L SPINE W/O CM
3 series · 11 of 33 positions shown, 13 images · non-contrast
Comparison: None.

CLINICAL DATA: Initial evaluation for acute low back pain.

EXAM:
CT LUMBAR SPINE WITHOUT CONTRAST
TECHNIQUE: Multidetector CT imaging of the lumbar spine was performed without
intravenous contrast administration. Multiplanar CT image
reconstructions were also generated.

[Series 4: l-spine wo soft tissue · axial · 0.34mm/px · z∈[-498,-322]mm · 3 of 144 slices shown, 4 images]
[im 34/144  soft-tissue]
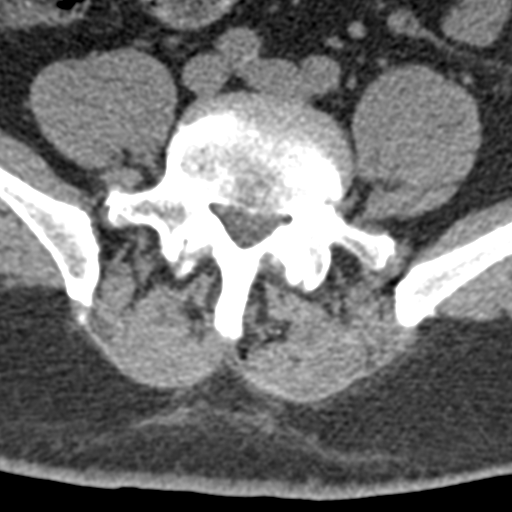
[im 34/144  bone]
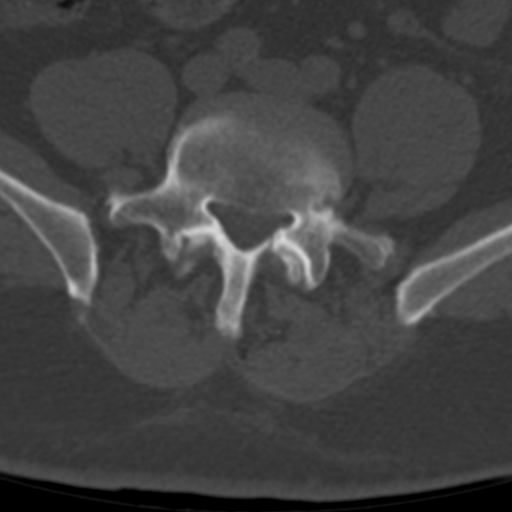
[im 78/144  bone]
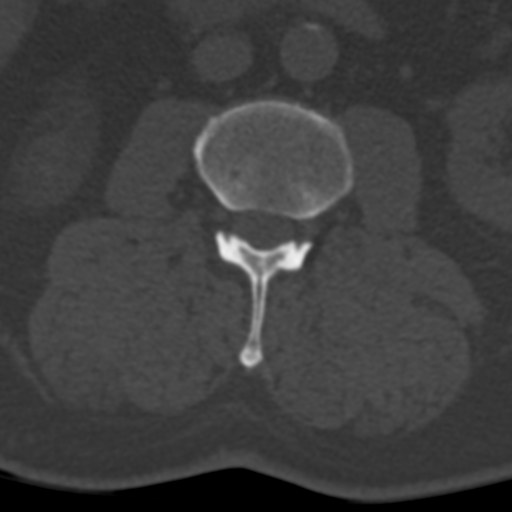
[im 122/144  bone]
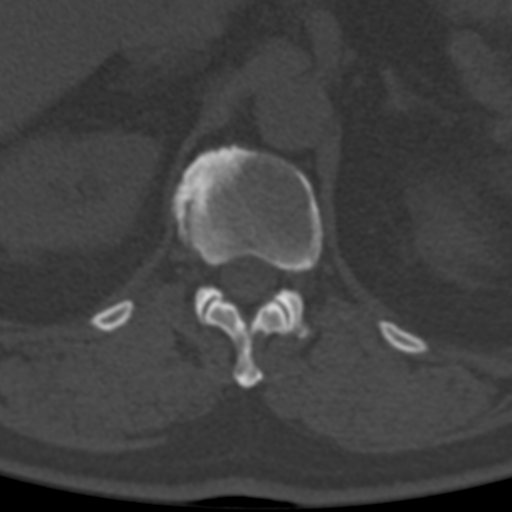

[Series 5: coronal bone · coronal · 0.29mm/px · 3 of 67 slices shown]
[im 14/67  bone]
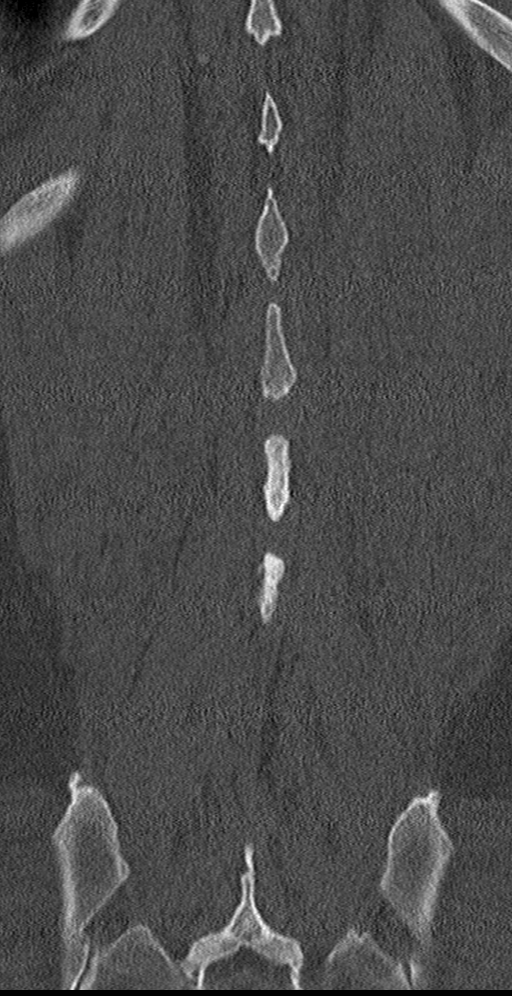
[im 27/67  bone]
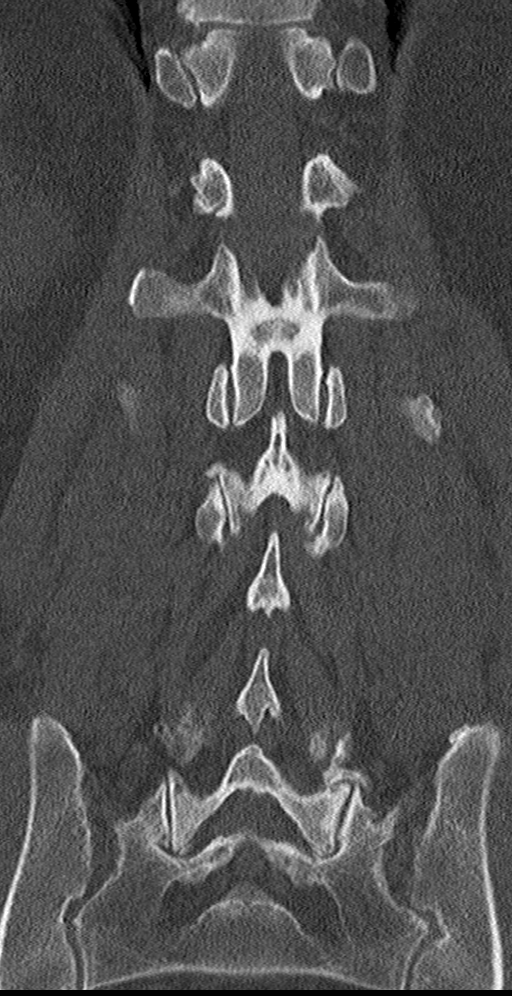
[im 40/67  bone]
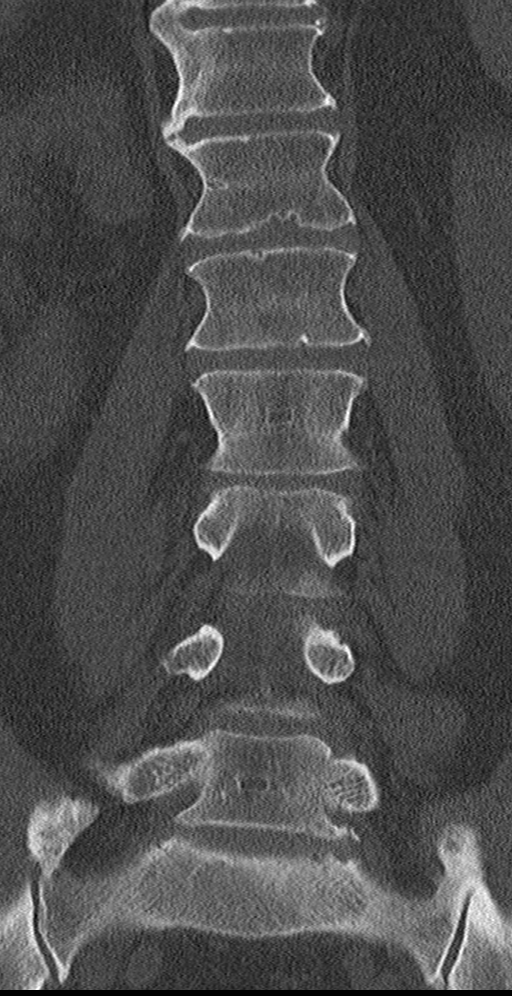

[Series 6: sagittal bone · sagittal · 0.33mm/px · 5 of 66 slices shown, 6 images]
[im 22/66  bone]
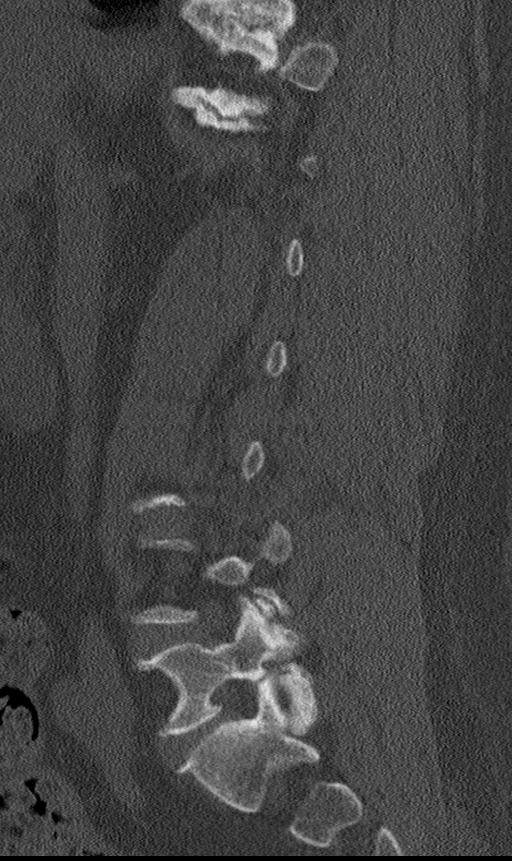
[im 28/66  bone]
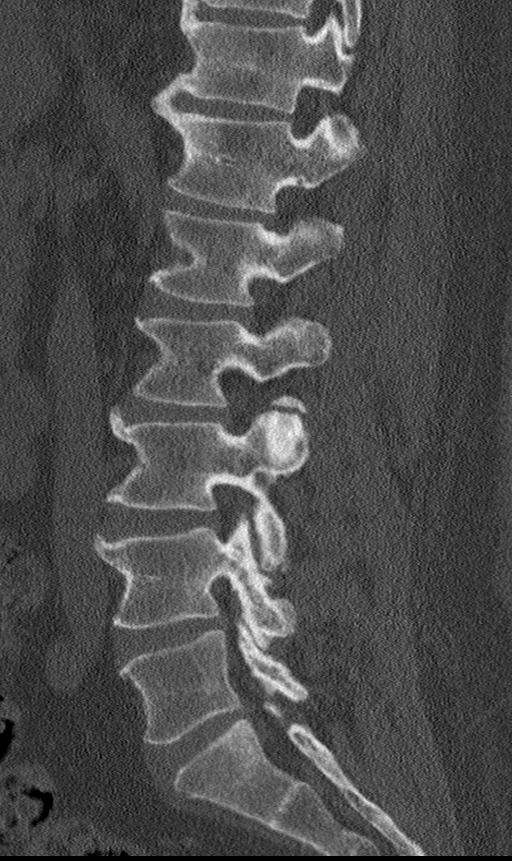
[im 33/66  soft-tissue]
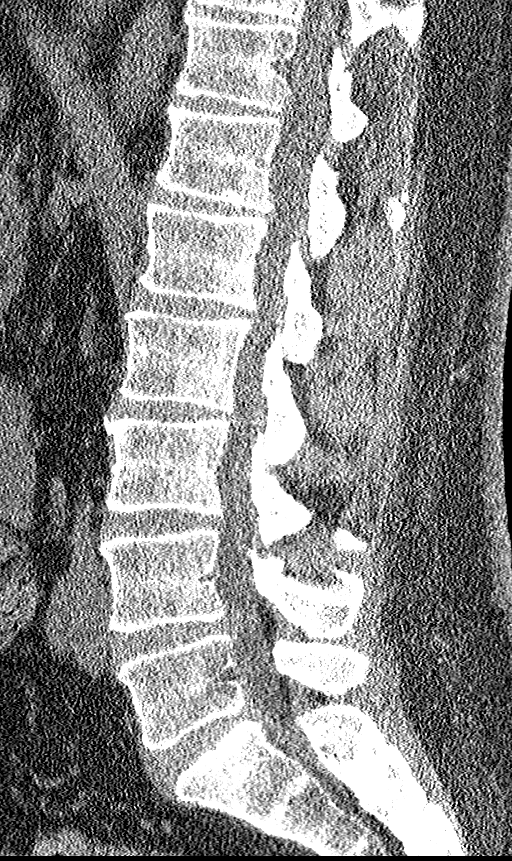
[im 33/66  bone]
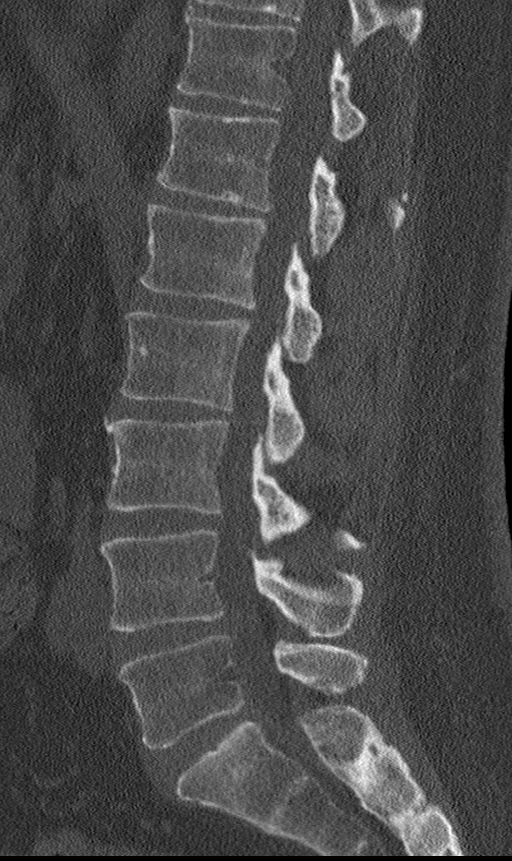
[im 38/66  bone]
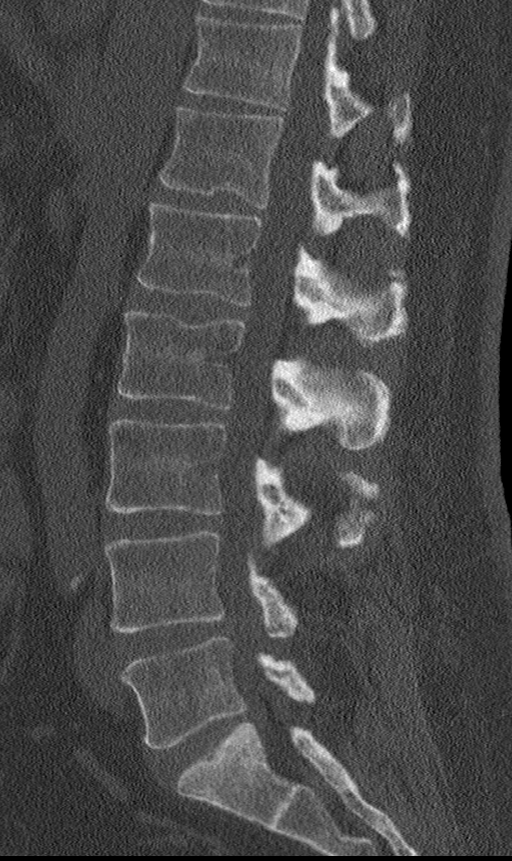
[im 44/66  bone]
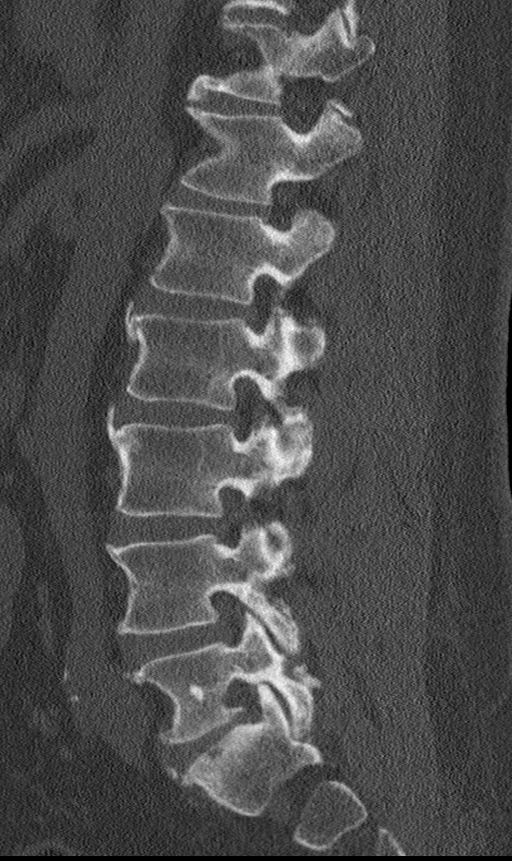

[11 of 33 positions shown; findings below may reference images not displayed]

FINDINGS: Segmentation: Standard. Lowest well-formed disc space labeled the
L5-S1 level.

Alignment: Levoscoliosis.  No listhesis.

Vertebrae: Vertebral body height maintained without acute or chronic
fracture. Visualized sacrum and pelvis intact. SI joints symmetric
and normal. No worrisome lytic or blastic osseous lesions.

Paraspinal and other soft tissues: Mild aorto bi-iliac
atherosclerotic disease. No acute abnormality.

Disc levels:

L1-2:  Mild facet hypertrophy. No stenosis.

L2-3:  Mild bilateral facet hypertrophy. No stenosis.

L3-4:  Mild bilateral facet hypertrophy. No stenosis.

L4-5: Disc bulge. Moderate right worse than left facet hypertrophy.
Mild canal with bilateral lateral recess stenosis. Mild bilateral L4
foraminal narrowing.

L5-S1: Degenerative intervertebral disc space narrowing with diffuse
disc bulge. Broad-based central disc protrusion contacts both of the
descending S1 nerve roots as they course through the lateral
recesses. Moderate facet hypertrophy. No spinal stenosis. Mild to
moderate left L5 foraminal narrowing.
IMPRESSION: 1. No acute abnormality within the lumbar spine.
2. Broad-based central disc protrusion at L5-S1, contacting both of
the descending S1 nerve roots as they course through the lateral
recesses.
3. Mild-to-moderate lower lumbar facet hypertrophy as above.
4. Aortic Atherosclerosis ([id number]).

## 2022-08-13 ENCOUNTER — Ambulatory Visit: Payer: Medicare Other | Attending: Family Medicine

## 2022-08-13 DIAGNOSIS — M25552 Pain in left hip: Secondary | ICD-10-CM | POA: Diagnosis present

## 2022-08-13 DIAGNOSIS — M6281 Muscle weakness (generalized): Secondary | ICD-10-CM | POA: Diagnosis present

## 2022-08-13 DIAGNOSIS — R262 Difficulty in walking, not elsewhere classified: Secondary | ICD-10-CM | POA: Insufficient documentation

## 2022-08-13 NOTE — Therapy (Signed)
OUTPATIENT PHYSICAL THERAPY LOWER EXTREMITY EVALUATION   Patient Name: Ryan Phelps MRN: [id number] DOB:08-22-1961, 61 y.o., male Today's Date: 08/13/2022   PT End of Session - 08/13/22 1032     Visit Number 1    Number of Visits 17    Date for PT Re-Evaluation 10/08/22    Authorization Type Medicare    PT Start Time 1000    PT Stop Time 1030    PT Time Calculation (min) 30 min    Activity Tolerance Patient tolerated treatment well    Behavior During Therapy WFL for tasks assessed/performed             Past Medical History:  Diagnosis Date   Allergy    Bipolar 2 disorder ([place])    Depression    GERD (gastroesophageal reflux disease)    Hyperlipidemia    Sleep apnea    History reviewed. No pertinent surgical history. There are no problems to display for this patient.   PCP:  Glenis Smoker, MD  REFERRING PROVIDER: Nada Boozer, MD  REFERRING DIAG: [phone] (ICD-10-CM) - Pain in left hip  THERAPY DIAG:  Pain in left hip - Plan: PT plan of care cert/re-cert  Muscle weakness (generalized) - Plan: PT plan of care cert/re-cert  Rationale for Evaluation and Treatment Rehabilitation  ONSET DATE: Chronic  SUBJECTIVE:   SUBJECTIVE STATEMENT: Pt presents to PT with reports of acute on chronic L hip pain and discomfort. Denies trauma or MOI except for fall onto L hip while on vacation in [place]. Was having this hip pain prior to fall. Denies N/T in L LE, also is unsure if anything aggravates pain, it stays at a constant level.   PERTINENT HISTORY: None  PAIN:  Are you having pain?  Yes: NPRS scale: 5/10 Pain location: L posterior-lateral hip Pain description: constant ache Aggravating factors: Unsure Relieving factors: Unsure  PRECAUTIONS: None  WEIGHT BEARING RESTRICTIONS No  FALLS:  Has patient fallen in last 6 months? Yes. Number of falls: one - fell while traveling to [place]: Lives with: lives with their  family Lives in: House/apartment Stairs: Yes - no barriers Has following equipment at home: None  OCCUPATION: Retired  PLOF: Independent and Independent with basic ADLs  PATIENT GOALS: decrease hip pain and get back to exercising and walking   OBJECTIVE:   DIAGNOSTIC FINDINGS:   N/A  PATIENT SURVEYS:  FOTO: 55% function; 63% predicted  COGNITION:  Overall cognitive status: Within functional limits for tasks assessed     SENSATION: WFL  POSTURE: rounded shoulders, forward head, and medium body habitus  PALPATION: TTP to L piriformis, L glute med  LOWER EXTREMITY ROM:  Active ROM Right eval Left eval  Hip flexion    Hip extension    Hip abduction    Hip adduction    Hip internal rotation 35 25  Hip external rotation 30 20  Knee flexion    Knee extension    Ankle dorsiflexion    Ankle plantarflexion    Ankle inversion    Ankle eversion     (Blank rows = not tested)  LOWER EXTREMITY MMT:  MMT Right eval Left eval  Hip flexion 5/5 4/5  Hip extension    Hip abduction 5/5 4/5  Hip adduction 5/5 4/5  Hip internal rotation 5/5 4/5  Hip external rotation 5/5 4/5  Knee flexion    Knee extension    Ankle dorsiflexion    Ankle plantarflexion    Ankle  inversion    Ankle eversion     (Blank rows = not tested)  LOWER EXTREMITY SPECIAL TESTS:  Hip special tests: Saralyn Pilar (FABER) test: negative and Hip scouring test: negative  FUNCTIONAL TESTS:  30 Second Sit to Stand: 8 reps; slight increase in pain  GAIT: Distance walked: 72ft Assistive device utilized: None Level of assistance: Complete Independence Comments: slight antalgic gait on L    TODAY'S TREATMENT: [facility] Adult PT Treatment:                                                DATE: 08/13/2022 Therapeutic Exercise: Supine piriformis stretch x 30" L Bridge x 10 S/L clamshell x 5 GTB L  PATIENT EDUCATION:  Education details: eval findings, FOTO, HEP, POC Person educated: Patient Education  method: Explanation, Demonstration, and Handouts Education comprehension: verbalized understanding and returned demonstration   HOME EXERCISE PROGRAM: Access Code: [email or web address] URL: [email or web address] Date: 08/13/2022 Prepared by: Octavio Manns  Exercises - Supine Piriformis Stretch with Leg Straight  - 1 x daily - 7 x weekly - 2-3 reps - 30-60 sec hold - Supine Bridge  - 1 x daily - 7 x weekly - 3 sets - 10 reps - 3 sec hold - Clamshell with Resistance  - 1 x daily - 7 x weekly - 3 sets - 10 reps - green theraband hold  ASSESSMENT:  CLINICAL IMPRESSION: Patient is a 61 y.o. M who was seen today for physical therapy evaluation and treatment for left hip pain. Physical findings are consistent with MD impression, as pt has decreased range and strength on L hip vs R with concordant pain with palpation. FOTO score demonstrates decrease in functional ability below PLOF. Pt would benefit from skilled PT services working to improve muscle strength and decrease soft tissue pain.    OBJECTIVE IMPAIRMENTS decreased activity tolerance, decreased balance, decreased mobility, decreased ROM, decreased strength, and pain.   ACTIVITY LIMITATIONS standing, squatting, and stairs  PARTICIPATION LIMITATIONS: community activity, yard work, and recreational exercise  PERSONAL FACTORS Time since onset of injury/illness/exacerbation are also affecting patient's functional outcome.   REHAB POTENTIAL: Excellent  CLINICAL DECISION MAKING: Stable/uncomplicated  EVALUATION COMPLEXITY: Low   GOALS: Goals reviewed with patient? No  SHORT TERM GOALS: Target date: 09/03/2022  Pt will be compliant and knowledgeable with initial HEP for improved comfort and carryover Baseline: initial HEP given  Goal status: INITIAL  2.  Pt will self report left hip pain no greater than 5/10 for improved comfort and functional ability Baseline: 7/10 at worst Goal status: INITIAL  LONG TERM GOALS: Target date:  10/08/2022   Pt will self report left hip pain no greater than 5/10 for improved comfort and functional ability Baseline: 7/10 at worst Goal status: INITIAL  2.  Pt will increase 30 Second Sit to Stand rep count to no less than 10 reps for improved balance, strength, and functional mobility Baseline: 8 reps; slight increase in pain Goal status: INITIAL   3.  Pt will improve FOTO function score to no less than 63% as proxy for functional improvement Baseline: 55% function Goal status: INITIAL   4.  Pt will increase L hip MMT to no less than 5/5 for all tested motions for improved comfort and functional ability  Baseline: see chart Goal status: INITIAL  PLAN: PT FREQUENCY: 1-2x/week  PT DURATION: 8 weeks  PLANNED INTERVENTIONS: Therapeutic exercises, Therapeutic activity, Neuromuscular re-education, Balance training, Gait training, Patient/Family education, Self Care, Joint mobilization, Dry Needling, Electrical stimulation, Cryotherapy, Moist heat, Manual therapy, and Re-evaluation  PLAN FOR NEXT SESSION: assess HEP response, progress proximal hip strength   Ward Chatters, PT 08/13/2022, 11:41 AM

## 2022-08-15 NOTE — Therapy (Signed)
OUTPATIENT PHYSICAL THERAPY TREATMENT NOTE   Patient Name: Ryan Phelps MRN: [id number] DOB:Jan 09, 1961, 61 y.o., male Today's Date: 08/17/2022  PCP: Shon Hale, MD REFERRING PROVIDER: Alethia Berthold, MD  END OF SESSION:   PT End of Session - 08/17/22 0745     Visit Number 2    Number of Visits 17    Date for PT Re-Evaluation 10/08/22    Authorization Type Medicare    PT Start Time 0745    PT Stop Time 0825    PT Time Calculation (min) 40 min    Activity Tolerance Patient tolerated treatment well    Behavior During Therapy WFL for tasks assessed/performed             Past Medical History:  Diagnosis Date   Allergy    Bipolar 2 disorder ([facility])    Depression    GERD (gastroesophageal reflux disease)    Hyperlipidemia    Sleep apnea    History reviewed. No pertinent surgical history. There are no problems to display for this patient.   REFERRING DIAG: M25.552 (ICD-10-CM) - Pain in left hip  THERAPY DIAG: Pain in left hip - Plan: PT plan of care cert/re-cert   Muscle weakness (generalized) - Plan: PT plan of care cert/re-cert  Rationale for Evaluation and Treatment Rehabilitation  PERTINENT HISTORY: none  PRECAUTIONS: none  SUBJECTIVE: No changes since last session  PAIN:  Are you having pain? Yes: NPRS scale: 4/10 Pain location: L hip Pain description: ache Aggravating factors: sitting, standing Relieving factors: position changes   OBJECTIVE: (objective measures completed at initial evaluation unless otherwise dated)   DIAGNOSTIC FINDINGS:            N/A   PATIENT SURVEYS:  FOTO: 55% function; 63% predicted   COGNITION:           Overall cognitive status: Within functional limits for tasks assessed                          SENSATION: WFL   POSTURE: rounded shoulders, forward head, and medium body habitus   PALPATION: TTP to L piriformis, L glute med   LOWER EXTREMITY ROM:   Active ROM Right eval Left eval  Hip  flexion      Hip extension      Hip abduction      Hip adduction      Hip internal rotation 35 25  Hip external rotation 30 20  Knee flexion      Knee extension      Ankle dorsiflexion      Ankle plantarflexion      Ankle inversion      Ankle eversion       (Blank rows = not tested)   LOWER EXTREMITY MMT:   MMT Right eval Left eval  Hip flexion 5/5 4/5  Hip extension      Hip abduction 5/5 4/5  Hip adduction 5/5 4/5  Hip internal rotation 5/5 4/5  Hip external rotation 5/5 4/5  Knee flexion      Knee extension      Ankle dorsiflexion      Ankle plantarflexion      Ankle inversion      Ankle eversion       (Blank rows = not tested)   LOWER EXTREMITY SPECIAL TESTS:  Hip special tests: Luisa Hart (FABER) test: negative and Hip scouring test: negative   FUNCTIONAL TESTS:  30 Second Sit to  Stand: 8 reps; slight increase in pain   GAIT: Distance walked: 46ft Assistive device utilized: None Level of assistance: Complete Independence Comments: slight antalgic gait on L       TODAY'S TREATMENT: [facility] Adult PT Treatment:                                                DATE: 08/17/22 Therapeutic Exercise: Nustep L4  min L piriformis stretch fig 4 30s x2 Bridge w/ball 15x Seated Hamstring stretch sitting 30sx2 Supine hip fallouts RTB 15x L clams RTB 15x SLR L 15x Curl ups 15x Manual Therapy:   L piriformis release 8 min  [facility] Adult PT Treatment:                                                DATE: 08/13/2022 Therapeutic Exercise: Supine piriformis stretch x 30" L Bridge x 10 S/L clamshell x 5 GTB L   PATIENT EDUCATION:  Education details: eval findings, FOTO, HEP, POC Person educated: Patient Education method: Explanation, Demonstration, and Handouts Education comprehension: verbalized understanding and returned demonstration     HOME EXERCISE PROGRAM: Access Code: [email or web address] URL: [email or web address] Date: 08/17/2022 Prepared by: Sharlynn Oliphant  Exercises - Supine Piriformis Stretch with Leg Straight  - 1 x daily - 7 x weekly - 2-3 reps - 30-60 sec hold - Supine Bridge  - 1 x daily - 7 x weekly - 3 sets - 10 reps - 3 sec hold - Clamshell with Resistance  - 1 x daily - 7 x weekly - 3 sets - 10 reps - green theraband hold - Supine Piriformis Stretch with Foot on Ground  - 2 x daily - 7 x weekly - 1 sets - 3 reps - 30s hold   ASSESSMENT:   CLINICAL IMPRESSION: No changes since last session, continues with L gluteal pain at piriformis region.   Today's session focused on HEP review, L hip/piriformis stretch and strength and aerobic work.  Added L piriformis release due to palpable tightness/irritation   OBJECTIVE IMPAIRMENTS decreased activity tolerance, decreased balance, decreased mobility, decreased ROM, decreased strength, and pain.    ACTIVITY LIMITATIONS standing, squatting, and stairs   PARTICIPATION LIMITATIONS: community activity, yard work, and recreational exercise   PERSONAL FACTORS Time since onset of injury/illness/exacerbation are also affecting patient's functional outcome.    REHAB POTENTIAL: Excellent   CLINICAL DECISION MAKING: Stable/uncomplicated   EVALUATION COMPLEXITY: Low     GOALS: Goals reviewed with patient? No   SHORT TERM GOALS: Target date: 09/03/2022  Pt will be compliant and knowledgeable with initial HEP for improved comfort and carryover Baseline: initial HEP given  Goal status: INITIAL   2.  Pt will self report left hip pain no greater than 5/10 for improved comfort and functional ability Baseline: 7/10 at worst Goal status: INITIAL   LONG TERM GOALS: Target date: 10/08/2022    Pt will self report left hip pain no greater than 5/10 for improved comfort and functional ability Baseline: 7/10 at worst Goal status: INITIAL   2.  Pt will increase 30 Second Sit to Stand rep count to no less than 10 reps for improved balance, strength, and functional mobility Baseline: 8 reps;  slight increase in  pain Goal status: INITIAL    3.  Pt will improve FOTO function score to no less than 63% as proxy for functional improvement Baseline: 55% function Goal status: INITIAL    4.  Pt will increase L hip MMT to no less than 5/5 for all tested motions for improved comfort and functional ability  Baseline: see chart Goal status: INITIAL   PLAN: PT FREQUENCY: 1-2x/week   PT DURATION: 8 weeks   PLANNED INTERVENTIONS: Therapeutic exercises, Therapeutic activity, Neuromuscular re-education, Balance training, Gait training, Patient/Family education, Self Care, Joint mobilization, Dry Needling, Electrical stimulation, Cryotherapy, Moist heat, Manual therapy, and Re-evaluation   PLAN FOR NEXT SESSION: assess HEP response, progress proximal hip strength, f/u on symptom increase from previous session   Lanice Shirts, PT 08/17/2022, 8:28 AM

## 2022-08-17 ENCOUNTER — Ambulatory Visit: Payer: Medicare Other

## 2022-08-17 DIAGNOSIS — M6281 Muscle weakness (generalized): Secondary | ICD-10-CM

## 2022-08-17 DIAGNOSIS — M25552 Pain in left hip: Secondary | ICD-10-CM

## 2022-08-20 ENCOUNTER — Ambulatory Visit: Payer: Medicare Other

## 2022-08-20 DIAGNOSIS — M25552 Pain in left hip: Secondary | ICD-10-CM | POA: Diagnosis not present

## 2022-08-20 DIAGNOSIS — M6281 Muscle weakness (generalized): Secondary | ICD-10-CM

## 2022-08-20 NOTE — Therapy (Signed)
OUTPATIENT PHYSICAL THERAPY TREATMENT NOTE   Patient Name: Ryan Phelps MRN: [id number] DOB:07-Sep-1961, 61 y.o., male Today's Date: 08/20/2022  PCP: Shon Hale, MD REFERRING PROVIDER: Alethia Berthold, MD  END OF SESSION:   PT End of Session - 08/20/22 0959     Visit Number 3    Number of Visits 17    Date for PT Re-Evaluation 10/08/22    Authorization Type Medicare    PT Start Time 1000    PT Stop Time 1040    PT Time Calculation (min) 40 min    Activity Tolerance Patient tolerated treatment well    Behavior During Therapy WFL for tasks assessed/performed             Past Medical History:  Diagnosis Date   Allergy    Bipolar 2 disorder ([facility])    Depression    GERD (gastroesophageal reflux disease)    Hyperlipidemia    Sleep apnea    History reviewed. No pertinent surgical history. There are no problems to display for this patient.   REFERRING DIAG: M25.552 (ICD-10-CM) - Pain in left hip  THERAPY DIAG: Pain in left hip - Plan: PT plan of care cert/re-cert   Muscle weakness (generalized) - Plan: PT plan of care cert/re-cert  Rationale for Evaluation and Treatment Rehabilitation  PERTINENT HISTORY: none  PRECAUTIONS: none  SUBJECTIVE: No change in symptoms to report but does note a deeper pain along with more superficial discomfort.  PAIN:  Are you having pain? Yes: NPRS scale: 4/10 Pain location: L hip Pain description: ache Aggravating factors: sitting, standing Relieving factors: position changes   OBJECTIVE: (objective measures completed at initial evaluation unless otherwise dated)   DIAGNOSTIC FINDINGS:            N/A   PATIENT SURVEYS:  FOTO: 55% function; 63% predicted   COGNITION:           Overall cognitive status: Within functional limits for tasks assessed                          SENSATION: WFL   POSTURE: rounded shoulders, forward head, and medium body habitus   PALPATION: TTP to L piriformis, L glute med    LOWER EXTREMITY ROM:   Active ROM Right eval Left eval  Hip flexion      Hip extension      Hip abduction      Hip adduction      Hip internal rotation 35 25  Hip external rotation 30 20  Knee flexion      Knee extension      Ankle dorsiflexion      Ankle plantarflexion      Ankle inversion      Ankle eversion       (Blank rows = not tested)   LOWER EXTREMITY MMT:   MMT Right eval Left eval  Hip flexion 5/5 4/5  Hip extension      Hip abduction 5/5 4/5  Hip adduction 5/5 4/5  Hip internal rotation 5/5 4/5  Hip external rotation 5/5 4/5  Knee flexion      Knee extension      Ankle dorsiflexion      Ankle plantarflexion      Ankle inversion      Ankle eversion       (Blank rows = not tested)   LOWER EXTREMITY SPECIAL TESTS:  Hip special tests: Luisa Hart (FABER) test: negative and Hip  scouring test: negative   FUNCTIONAL TESTS:  30 Second Sit to Stand: 8 reps; slight increase in pain   GAIT: Distance walked: 23ft Assistive device utilized: None Level of assistance: Complete Independence Comments: slight antalgic gait on L       TODAY'S TREATMENT: [facility] Adult PT Treatment:                                                DATE: 08/20/22 Therapeutic Exercise: Nustep L2  min 8 L piriformis stretch fig 4 30s x2 Bridge w/ball 15x DKTC w/ball (lower abdominals) Seated Hamstring stretch sitting 30sx2 Supine hip fallouts RTB 15x Bridge against RTB L clams RTB 15x SLR L 15x Curl ups 15x  [facility] Adult PT Treatment:                                                DATE: 08/17/22 Therapeutic Exercise: Nustep L4  min 8 L piriformis stretch fig 4 30s x2 Bridge w/ball 15x Seated Hamstring stretch sitting 30sx2 Supine hip fallouts RTB 15x L clams RTB 15x SLR L 15x Curl ups 15x Manual Therapy:   L piriformis release 8 min  [facility] Adult PT Treatment:                                                DATE: 08/13/2022 Therapeutic Exercise: Supine piriformis stretch x 30"  L Bridge x 10 S/L clamshell x 5 GTB L   PATIENT EDUCATION:  Education details: eval findings, FOTO, HEP, POC Person educated: Patient Education method: Explanation, Demonstration, and Handouts Education comprehension: verbalized understanding and returned demonstration     HOME EXERCISE PROGRAM: Access Code: [email or web address] URL: [email or web address] Date: 08/17/2022 Prepared by: Gustavus Bryant  Exercises - Supine Piriformis Stretch with Leg Straight  - 1 x daily - 7 x weekly - 2-3 reps - 30-60 sec hold - Supine Bridge  - 1 x daily - 7 x weekly - 3 sets - 10 reps - 3 sec hold - Clamshell with Resistance  - 1 x daily - 7 x weekly - 3 sets - 10 reps - green theraband hold - Supine Piriformis Stretch with Foot on Ground  - 2 x daily - 7 x weekly - 1 sets - 3 reps - 30s hold   ASSESSMENT:   CLINICAL IMPRESSION: Scaled back resistance on Nustep, continued to stretch and strengthen L hip region.  L hip assessment shows pain with scouring as well as FADIR.  Today's session focused on hip/core strength and stabilization tasks.   OBJECTIVE IMPAIRMENTS decreased activity tolerance, decreased balance, decreased mobility, decreased ROM, decreased strength, and pain.    ACTIVITY LIMITATIONS standing, squatting, and stairs   PARTICIPATION LIMITATIONS: community activity, yard work, and recreational exercise   PERSONAL FACTORS Time since onset of injury/illness/exacerbation are also affecting patient's functional outcome.    REHAB POTENTIAL: Excellent   CLINICAL DECISION MAKING: Stable/uncomplicated   EVALUATION COMPLEXITY: Low     GOALS: Goals reviewed with patient? No   SHORT TERM GOALS: Target date: 09/03/2022  Pt will be compliant and knowledgeable with initial HEP for improved  comfort and carryover Baseline: initial HEP given  Goal status: INITIAL   2.  Pt will self report left hip pain no greater than 5/10 for improved comfort and functional ability Baseline: 7/10 at  worst Goal status: INITIAL   LONG TERM GOALS: Target date: 10/08/2022    Pt will self report left hip pain no greater than 5/10 for improved comfort and functional ability Baseline: 7/10 at worst Goal status: INITIAL   2.  Pt will increase 30 Second Sit to Stand rep count to no less than 10 reps for improved balance, strength, and functional mobility Baseline: 8 reps; slight increase in pain Goal status: INITIAL    3.  Pt will improve FOTO function score to no less than 63% as proxy for functional improvement Baseline: 55% function Goal status: INITIAL    4.  Pt will increase L hip MMT to no less than 5/5 for all tested motions for improved comfort and functional ability  Baseline: see chart Goal status: INITIAL   PLAN: PT FREQUENCY: 1-2x/week   PT DURATION: 8 weeks   PLANNED INTERVENTIONS: Therapeutic exercises, Therapeutic activity, Neuromuscular re-education, Balance training, Gait training, Patient/Family education, Self Care, Joint mobilization, Dry Needling, Electrical stimulation, Cryotherapy, Moist heat, Manual therapy, and Re-evaluation   PLAN FOR NEXT SESSION: assess HEP response, progress proximal hip strength, f/u on symptom increase from previous session   Lanice Shirts, PT 08/20/2022, 10:46 AM

## 2022-08-22 ENCOUNTER — Ambulatory Visit: Payer: Medicare Other

## 2022-08-22 DIAGNOSIS — R262 Difficulty in walking, not elsewhere classified: Secondary | ICD-10-CM

## 2022-08-22 DIAGNOSIS — M6281 Muscle weakness (generalized): Secondary | ICD-10-CM

## 2022-08-22 DIAGNOSIS — M25552 Pain in left hip: Secondary | ICD-10-CM | POA: Diagnosis not present

## 2022-08-22 NOTE — Therapy (Signed)
OUTPATIENT PHYSICAL THERAPY TREATMENT NOTE   Patient Name: Ryan Phelps MRN: [id number] DOB:31-Dec-1960, 61 y.o., male Today's Date: 08/22/2022  PCP: Glenis Smoker, MD REFERRING PROVIDER: Nada Boozer, MD  END OF SESSION:   PT End of Session - 08/22/22 1043     Visit Number 4    Number of Visits 17    Date for PT Re-Evaluation 10/08/22    Authorization Type Medicare    PT Start Time 1045    PT Stop Time 1110    PT Time Calculation (min) 25 min    Activity Tolerance Patient tolerated treatment well    Behavior During Therapy WFL for tasks assessed/performed             Past Medical History:  Diagnosis Date   Allergy    Bipolar 2 disorder ([place])    Depression    GERD (gastroesophageal reflux disease)    Hyperlipidemia    Sleep apnea    History reviewed. No pertinent surgical history. There are no problems to display for this patient.   REFERRING DIAG: M25.552 (ICD-10-CM) - Pain in left hip  THERAPY DIAG: Pain in left hip - Plan: PT plan of care cert/re-cert   Muscle weakness (generalized) - Plan: PT plan of care cert/re-cert  Rationale for Evaluation and Treatment Rehabilitation  PERTINENT HISTORY: none  PRECAUTIONS: none  SUBJECTIVE: Les soreness to report today.  Usual activities have not been as painful.  PAIN:  Are you having pain? Yes: NPRS scale: 4/10 Pain location: L hip Pain description: ache Aggravating factors: sitting, standing Relieving factors: position changes   OBJECTIVE: (objective measures completed at initial evaluation unless otherwise dated)   DIAGNOSTIC FINDINGS:            N/A   PATIENT SURVEYS:  FOTO: 55% function; 63% predicted   COGNITION:           Overall cognitive status: Within functional limits for tasks assessed                          SENSATION: WFL   POSTURE: rounded shoulders, forward head, and medium body habitus   PALPATION: TTP to L piriformis, L glute med   LOWER EXTREMITY ROM:    Active ROM Right eval Left eval  Hip flexion      Hip extension      Hip abduction      Hip adduction      Hip internal rotation 35 25  Hip external rotation 30 20  Knee flexion      Knee extension      Ankle dorsiflexion      Ankle plantarflexion      Ankle inversion      Ankle eversion       (Blank rows = not tested)   LOWER EXTREMITY MMT:   MMT Right eval Left eval  Hip flexion 5/5 4/5  Hip extension      Hip abduction 5/5 4/5  Hip adduction 5/5 4/5  Hip internal rotation 5/5 4/5  Hip external rotation 5/5 4/5  Knee flexion      Knee extension      Ankle dorsiflexion      Ankle plantarflexion      Ankle inversion      Ankle eversion       (Blank rows = not tested)   LOWER EXTREMITY SPECIAL TESTS:  Hip special tests: Saralyn Pilar (FABER) test: negative and Hip scouring test: negative  FUNCTIONAL TESTS:  30 Second Sit to Stand: 8 reps; slight increase in pain   GAIT: Distance walked: 66ft Assistive device utilized: None Level of assistance: Complete Independence Comments: slight antalgic gait on L       TODAY'S TREATMENT: [facility] Adult PT Treatment:                                                DATE: 08/22/22 Therapeutic Exercise: Nustep L2  min 8 L piriformis stretch fig 4 30s x2 Bridge w/ball 15x DKTC w/ball (lower abdominals) 15x Supine hip fallouts RTB 15x Bridge against RTB 15x B clams RTB 15x SLR B 15x Curl ups 15x 90/90 hips/knees Sidelie abduction 15x B  [facility] Adult PT Treatment:                                                DATE: 08/20/22 Therapeutic Exercise: Nustep L2  min 8 L piriformis stretch fig 4 30s x2 Bridge w/ball 15x DKTC w/ball (lower abdominals) Seated Hamstring stretch sitting 30sx2 Supine hip fallouts RTB 15x Bridge against RTB L clams RTB 15x SLR L 15x Curl ups 15x  [facility] Adult PT Treatment:                                                DATE: 08/17/22 Therapeutic Exercise: Nustep L4  min 8 L piriformis stretch fig 4  30s x2 Bridge w/ball 15x Seated Hamstring stretch sitting 30sx2 Supine hip fallouts RTB 15x L clams RTB 15x SLR L 15x Curl ups 15x Manual Therapy:   L piriformis release 8 min  [facility] Adult PT Treatment:                                                DATE: 08/13/2022 Therapeutic Exercise: Supine piriformis stretch x 30" L Bridge x 10 S/L clamshell x 5 GTB L   PATIENT EDUCATION:  Education details: eval findings, FOTO, HEP, POC Person educated: Patient Education method: Explanation, Demonstration, and Handouts Education comprehension: verbalized understanding and returned demonstration     HOME EXERCISE PROGRAM: Access Code: [email or web address] URL: [email or web address] Date: 08/17/2022 Prepared by: Gustavus Bryant  Exercises - Supine Piriformis Stretch with Leg Straight  - 1 x daily - 7 x weekly - 2-3 reps - 30-60 sec hold - Supine Bridge  - 1 x daily - 7 x weekly - 3 sets - 10 reps - 3 sec hold - Clamshell with Resistance  - 1 x daily - 7 x weekly - 3 sets - 10 reps - green theraband hold - Supine Piriformis Stretch with Foot on Ground  - 2 x daily - 7 x weekly - 1 sets - 3 reps - 30s hold   ASSESSMENT:   CLINICAL IMPRESSION: Overall pain and discomfort have decreased since last session.  Not able to identify any specific activity that feels better just a general overall decrease.  Continued to focus on core, hip strength as well as flexibility tasks.  Added sidelie abduction   OBJECTIVE IMPAIRMENTS decreased activity tolerance, decreased balance, decreased mobility, decreased ROM, decreased strength, and pain.    ACTIVITY LIMITATIONS standing, squatting, and stairs   PARTICIPATION LIMITATIONS: community activity, yard work, and recreational exercise   PERSONAL FACTORS Time since onset of injury/illness/exacerbation are also affecting patient's functional outcome.    REHAB POTENTIAL: Excellent   CLINICAL DECISION MAKING: Stable/uncomplicated   EVALUATION  COMPLEXITY: Low     GOALS: Goals reviewed with patient? No   SHORT TERM GOALS: Target date: 09/03/2022  Pt will be compliant and knowledgeable with initial HEP for improved comfort and carryover Baseline: initial HEP given  Goal status: INITIAL   2.  Pt will self report left hip pain no greater than 5/10 for improved comfort and functional ability Baseline: 7/10 at worst Goal status: INITIAL   LONG TERM GOALS: Target date: 10/08/2022    Pt will self report left hip pain no greater than 5/10 for improved comfort and functional ability Baseline: 7/10 at worst Goal status: INITIAL   2.  Pt will increase 30 Second Sit to Stand rep count to no less than 10 reps for improved balance, strength, and functional mobility Baseline: 8 reps; slight increase in pain Goal status: INITIAL    3.  Pt will improve FOTO function score to no less than 63% as proxy for functional improvement Baseline: 55% function Goal status: INITIAL    4.  Pt will increase L hip MMT to no less than 5/5 for all tested motions for improved comfort and functional ability  Baseline: see chart Goal status: INITIAL   PLAN: PT FREQUENCY: 1-2x/week   PT DURATION: 8 weeks   PLANNED INTERVENTIONS: Therapeutic exercises, Therapeutic activity, Neuromuscular re-education, Balance training, Gait training, Patient/Family education, Self Care, Joint mobilization, Dry Needling, Electrical stimulation, Cryotherapy, Moist heat, Manual therapy, and Re-evaluation   PLAN FOR NEXT SESSION: assess HEP response, progress proximal hip strength, f/u on symptom increase from previous session   Hildred Laser, PT 08/22/2022, 11:26 AM

## 2022-08-24 ENCOUNTER — Ambulatory Visit: Payer: Medicare Other

## 2022-08-27 ENCOUNTER — Ambulatory Visit: Payer: Medicare Other

## 2022-08-28 ENCOUNTER — Other Ambulatory Visit: Payer: Self-pay

## 2022-08-28 ENCOUNTER — Emergency Department (HOSPITAL_COMMUNITY): Payer: Medicare Other

## 2022-08-28 ENCOUNTER — Encounter (HOSPITAL_COMMUNITY): Payer: Self-pay | Admitting: Emergency Medicine

## 2022-08-28 ENCOUNTER — Emergency Department (HOSPITAL_COMMUNITY)
Admission: EM | Admit: 2022-08-28 | Discharge: 2022-08-29 | Disposition: A | Discharge status: Home / Self Care | Payer: Medicare Other | Attending: Emergency Medicine | Admitting: Emergency Medicine

## 2022-08-28 ENCOUNTER — Other Ambulatory Visit: Payer: Self-pay | Admitting: Family Medicine

## 2022-08-28 ENCOUNTER — Ambulatory Visit
Admission: RE | Admit: 2022-08-28 | Discharge: 2022-08-28 | Disposition: A | Discharge status: Home / Self Care | Payer: Medicare Other | Source: Ambulatory Visit | Attending: Family Medicine | Admitting: Family Medicine

## 2022-08-28 DIAGNOSIS — R0600 Dyspnea, unspecified: Secondary | ICD-10-CM | POA: Diagnosis not present

## 2022-08-28 DIAGNOSIS — F419 Anxiety disorder, unspecified: Secondary | ICD-10-CM | POA: Diagnosis not present

## 2022-08-28 DIAGNOSIS — R0609 Other forms of dyspnea: Secondary | ICD-10-CM

## 2022-08-28 DIAGNOSIS — R55 Syncope and collapse: Secondary | ICD-10-CM | POA: Diagnosis not present

## 2022-08-28 DIAGNOSIS — R0602 Shortness of breath: Secondary | ICD-10-CM | POA: Diagnosis present

## 2022-08-28 LAB — CBC
HCT: 52.4 % — ABNORMAL HIGH (ref 39.0–52.0)
Hemoglobin: 18.2 g/dL — ABNORMAL HIGH (ref 13.0–17.0)
MCH: 28.5 pg (ref 26.0–34.0)
MCHC: 34.7 g/dL (ref 30.0–36.0)
MCV: 82 fL (ref 80.0–100.0)
Platelets: 219 10*3/uL (ref 150–400)
RBC: 6.39 MIL/uL — ABNORMAL HIGH (ref 4.22–5.81)
RDW: 13.5 % (ref 11.5–15.5)
WBC: 4.5 10*3/uL (ref 4.0–10.5)
nRBC: 0 % (ref 0.0–0.2)

## 2022-08-28 LAB — BASIC METABOLIC PANEL
Anion gap: 12 (ref 5–15)
BUN: 19 mg/dL (ref 8–23)
CO2: 19 mmol/L — ABNORMAL LOW (ref 22–32)
Calcium: 9.7 mg/dL (ref 8.9–10.3)
Chloride: 105 mmol/L (ref 98–111)
Creatinine, Ser: 1.12 mg/dL (ref 0.61–1.24)
GFR, Estimated: >60 mL/min (ref >60)
Glucose, Bld: 163 mg/dL — ABNORMAL HIGH (ref 70–99)
Potassium: 3.8 mmol/L (ref 3.5–5.1)
Sodium: 136 mmol/L (ref 135–145)

## 2022-08-28 LAB — BRAIN NATRIURETIC PEPTIDE: B Natriuretic Peptide: 4.6 pg/mL (ref 0.0–100.0)

## 2022-08-28 LAB — TROPONIN I (HIGH SENSITIVITY): Troponin I (High Sensitivity): 3 ng/L (ref <18)

## 2022-08-28 NOTE — ED Triage Notes (Signed)
Per [facility] pt c/o shortness of breath over the past month- two months. Seen by PCP yesterday and had EKG, labs and chest x-ray done. Heard back from PCP today stating exam was reassuring but was going to send him to cardiologist for further evaluation.

## 2022-08-28 NOTE — ED Provider Triage Note (Signed)
Emergency Medicine Provider Triage Evaluation Note  Ryan Phelps , a 61 y.o. male  was evaluated in triage.  Pt complains of shortness of breath x3 months.  Feels like "I am exhaling more than normal and heavier than normal".  No specific chest pain, seen by primary yesterday and referred to cardiologist but has not been seen yet.  Denies chest pain.  Review of Systems  Per HPI  Physical Exam  BP 119/79   Pulse 77   Temp 98.3 F (36.8 C) (Oral)   Resp 19   Ht 6' (1.829 m)   Wt 102.1 kg   SpO2 98%   BMI 30.52 kg/m  Gen:   Awake, no distress   Resp:  Normal effort  MSK:   Moves extremities without difficulty  Other:    Medical Decision Making  Medically screening exam initiated at 3:52 PM.  Appropriate orders placed.  Albertus Chiarelli Laurie was informed that the remainder of the evaluation will be completed by another provider, this initial triage assessment does not replace that evaluation, and the importance of remaining in the ED until their evaluation is complete.     Sherrill Raring, PA-C 08/28/22 1553

## 2022-08-29 ENCOUNTER — Emergency Department (HOSPITAL_COMMUNITY): Payer: Medicare Other

## 2022-08-29 ENCOUNTER — Ambulatory Visit: Payer: Medicare Other

## 2022-08-29 LAB — TROPONIN I (HIGH SENSITIVITY): Troponin I (High Sensitivity): 4 ng/L (ref <18)

## 2022-08-29 MED ORDER — LORAZEPAM 1 MG PO TABS: 1.0000 mg | ORAL_TABLET | Freq: Three times a day (TID) | ORAL | 0 refills | Status: DC | PRN

## 2022-08-29 MED ORDER — SODIUM CHLORIDE 0.9 % IV BOLUS
1000.0000 mL | Freq: Once | INTRAVENOUS | Status: AC
  Administered 2022-08-29: 1000 mL via INTRAVENOUS

## 2022-08-29 MED ORDER — DIAZEPAM 5 MG/ML IJ SOLN
5.0000 mg | Freq: Once | INTRAMUSCULAR | Status: AC
  Administered 2022-08-29: 5 mg via INTRAVENOUS
  Filled 2022-08-29: qty 2

## 2022-08-29 MED ORDER — IOHEXOL 350 MG/ML SOLN
50.0000 mL | Freq: Once | INTRAVENOUS | Status: AC | PRN
  Administered 2022-08-29: 50 mL via INTRAVENOUS

## 2022-08-29 MED ORDER — IPRATROPIUM-ALBUTEROL 0.5-2.5 (3) MG/3ML IN SOLN
3.0000 mL | Freq: Once | RESPIRATORY_TRACT | Status: AC
  Administered 2022-08-29: 3 mL via RESPIRATORY_TRACT
  Filled 2022-08-29: qty 3

## 2022-08-29 NOTE — Discharge Instructions (Addendum)
Please follow-up with your PCP and your psychiatrist >> Please follow-up with cardiology as recommended by your PCP  It was a pleasure caring for you today in the emergency department.  Please return to the emergency department for any worsening or worrisome symptoms.

## 2022-08-29 NOTE — ED Provider Notes (Signed)
[facility] EMERGENCY DEPARTMENT Provider Note   CSN: [id number] Arrival date & time: 08/28/22  1537     History  Chief Complaint  Patient presents with   Shortness of Breath    Ryan Phelps is a 61 y.o. male.  Patient as above with significant medical history as below, including bipolar 2, GERD, HLD, sleep apnea who presents to the ED with complaint of dyspnea.  Vague chest discomfort.  Patient reports ongoing difficulty breathing over the past 3 months, gradually worsening.  Also reports he felt as though he was going to pass out today, near syncope.  Patient describes sensation as a heaviness to his breathing, mild chest tightness.  Worsened with deep inspiration.  No ongoing nausea or vomiting.  No palpitations.  No numbness or tingling seems new.  He has been referred to cardiologist by PCP but has not scheduled appointment yet.  Present to the ED today secondary to near syncope.  This occurred upon standing, did not have LOC.  Sensation resolved spontaneously.  Lightheaded, diaphoretic, mildly nauseated, resolved after less than 30 seconds.     Past Medical History:  Diagnosis Date   Allergy    Bipolar 2 disorder ([facility])    Depression    GERD (gastroesophageal reflux disease)    Hyperlipidemia    Sleep apnea     History reviewed. No pertinent surgical history.   The history is provided by the patient. No language interpreter was used.  Shortness of Breath Associated symptoms: diaphoresis   Associated symptoms: no abdominal pain, no chest pain, no cough, no fever, no headaches, no rash and no vomiting        Home Medications Prior to Admission medications   Medication Sig Start Date End Date Taking? Authorizing Provider  LORazepam (ATIVAN) 1 MG tablet Take 1 tablet (1 mg total) by mouth 3 (three) times daily as needed for anxiety. 08/29/22  Yes Tanda Rockers A, DO  ARIPiprazole (ABILIFY) 15 MG tablet Take 15 mg by mouth daily.    [provider]  buPROPion (WELLBUTRIN SR) 150 MG 12 hr tablet Take 450 mg by mouth daily.    [provider]  cetirizine (ZYRTEC) 10 MG chewable tablet Chew 10 mg by mouth daily.    [provider]  Deutetrabenazine (AUSTEDO) 12 MG TABS Take 12 mg by mouth 2 (two) times daily. Patient not taking: Reported on 08/13/2022    [provider]  Deutetrabenazine (AUSTEDO) 6 MG TABS Take 6 mg by mouth 2 (two) times daily. Takes with the 12mg  tablet Patient not taking: Reported on 08/13/2022    [provider]  divalproex (DEPAKOTE) 500 MG DR tablet Take 500 mg by mouth 3 (three) times daily.    [provider]  escitalopram (LEXAPRO) 20 MG tablet Take 30 mg by mouth daily.    [provider]  methocarbamol (ROBAXIN) 500 MG tablet Take 1-2 tablets (500-1,000 mg total) by mouth every 6 (six) hours as needed for muscle spasms. Patient not taking: Reported on 08/13/2022 08/31/21   Molpus, 10/31/21, MD  methylphenidate (RITALIN LA) 10 MG 24 hr capsule Take 1 capsule (10 mg total) by mouth 2 (two) times daily.    [provider]  naproxen (NAPROSYN) 375 MG tablet Take 1 tablet twice daily as needed for pain Patient not taking: Reported on 08/13/2022 08/31/21   Molpus, 10/31/21, MD  NON FORMULARY CPAP machine uses nightly    [provider]  pravastatin (PRAVACHOL) 20 MG tablet  Take 20 mg by mouth daily.    [provider]  testosterone cypionate (DEPOTESTOTERONE CYPIONATE) 100 MG/ML injection Inject 0.5 mLs into the muscle once a week. 01/27/21   [provider]  zolpidem (AMBIEN) 10 MG tablet Take 10 mg by mouth at bedtime as needed for sleep.    [provider]      Allergies    Penicillins    Review of Systems   Review of Systems  Constitutional:  Positive for diaphoresis. Negative for chills and fever.  HENT:  Negative for facial swelling and trouble swallowing.   Eyes:  Negative for photophobia and visual disturbance.   Respiratory:  Positive for chest tightness and shortness of breath. Negative for cough.   Cardiovascular:  Negative for chest pain and palpitations.  Gastrointestinal:  Negative for abdominal pain, diarrhea and vomiting.  Endocrine: Negative for polydipsia and polyuria.  Genitourinary:  Negative for difficulty urinating and hematuria.  Musculoskeletal:  Negative for gait problem and joint swelling.  Skin:  Negative for pallor and rash.  Neurological:  Negative for syncope and headaches.  Psychiatric/Behavioral:  Negative for agitation and confusion. The patient is nervous/anxious.     Physical Exam Updated Vital Signs BP 126/73   Pulse 72   Temp 98.3 F (36.8 C) (Oral)   Resp 16   Ht 6' (1.829 m)   Wt 102.1 kg   SpO2 98%   BMI 30.52 kg/m  Physical Exam Vitals and nursing note reviewed.  Constitutional:      General: He is not in acute distress.    Appearance: He is well-developed.  HENT:     Head: Normocephalic and atraumatic.     Right Ear: External ear normal.     Left Ear: External ear normal.     Mouth/Throat:     Mouth: Mucous membranes are moist.  Eyes:     General: No scleral icterus. Cardiovascular:     Rate and Rhythm: Normal rate and regular rhythm.     Pulses: Normal pulses.          Radial pulses are 2+ on the right side and 2+ on the left side.     Heart sounds: Normal heart sounds.     No S3 or S4 sounds.  Pulmonary:     Effort: Pulmonary effort is normal. No tachypnea, accessory muscle usage or respiratory distress.     Breath sounds: Normal breath sounds. No decreased breath sounds or wheezing.  Abdominal:     General: Abdomen is flat.     Palpations: Abdomen is soft.     Tenderness: There is no abdominal tenderness.  Musculoskeletal:        General: Normal range of motion.     Cervical back: Normal range of motion.     Right lower leg: No edema.     Left lower leg: No edema.  Skin:    General: Skin is warm and dry.     Capillary Refill:  Capillary refill takes less than 2 seconds.  Neurological:     Mental Status: He is alert and oriented to person, place, and time.  Psychiatric:        Mood and Affect: Mood is anxious.        Behavior: Behavior normal. Behavior is cooperative.     ED Results / Procedures / Treatments   Labs (all labs ordered are listed, but only abnormal results are displayed) Labs Reviewed  BASIC METABOLIC PANEL - Abnormal; Notable for the following components:  Result Value   CO2 19 (*)    Glucose, Bld 163 (*)    All other components within normal limits  CBC - Abnormal; Notable for the following components:   RBC 6.39 (*)    Hemoglobin 18.2 (*)    HCT 52.4 (*)    All other components within normal limits  BRAIN NATRIURETIC PEPTIDE  TROPONIN I (HIGH SENSITIVITY)  TROPONIN I (HIGH SENSITIVITY)    EKG EKG Interpretation  Date/Time:  Tuesday August 28 2022 15:26:20 EDT Ventricular Rate:  76 PR Interval:  138 QRS Duration: 100 QT Interval:  390 QTC Calculation: 438 R Axis:   -33 Text Interpretation: Normal sinus rhythm Left axis deviation Abnormal ECG No previous ECGs available Interpretation limited secondary to artifact no stemi Confirmed by Tanda RockersGray, Damel Querry ([id number]) on 08/29/2022 2:46:51 AM  Radiology CT Angio Chest PE W and/or Wo Contrast  Result Date: 08/29/2022 CLINICAL DATA:  Pulmonary embolism suspected, unknown D-dimer. Shortness of breath. EXAM: CT ANGIOGRAPHY CHEST WITH CONTRAST TECHNIQUE: Multidetector CT imaging of the chest was performed using the standard protocol during bolus administration of intravenous contrast. Multiplanar CT image reconstructions and MIPs were obtained to evaluate the vascular anatomy. RADIATION DOSE REDUCTION: This exam was performed according to the departmental dose-optimization program which includes automated exposure control, adjustment of the mA and/or kV according to patient size and/or use of iterative reconstruction technique. CONTRAST:  50mL  OMNIPAQUE IOHEXOL 350 MG/ML SOLN COMPARISON:  None Available. FINDINGS: Cardiovascular: Satisfactory opacification of the pulmonary arteries to the segmental level. No evidence of pulmonary embolism. Normal heart size. No pericardial effusion. Mediastinum/Nodes: Granulomatous type calcification of a subcarinal lymph node. Fatty hiatal hernia. Lungs/Pleura: There is no edema, consolidation, effusion, or pneumothorax. Upper Abdomen: No acute finding Musculoskeletal: Generalized spondylitic endplate spurring and diffuse thoracic ankylosis, known from dedicated thoracic spine CT in 2022. No focal or acute finding. Review of the MIP images confirms the above findings. IMPRESSION: Negative for pulmonary embolism or other acute finding. Electronically Signed   By: Tiburcio PeaJonathan  Watts M.D.   On: 08/29/2022 04:36   DG Chest 2 View  Result Date: 08/28/2022 CLINICAL DATA:  Chest pain. EXAM: CHEST - 2 VIEW COMPARISON:  August 28, 2022. FINDINGS: The heart size and mediastinal contours are within normal limits. Both lungs are clear. The visualized skeletal structures are unremarkable. IMPRESSION: No active cardiopulmonary disease. Electronically Signed   By: Lupita RaiderJames  Green Jr M.D.   On: 08/28/2022 16:41   DG Chest 2 View  Result Date: 08/28/2022 CLINICAL DATA:  Dyspnea on exertion EXAM: CHEST - 2 VIEW COMPARISON:  None Available. FINDINGS: Transverse diameter of heart is increased. There are no signs of pulmonary edema or focal pulmonary consolidation. There is no pleural effusion or pneumothorax. IMPRESSION: There are no signs of pulmonary edema or focal pulmonary consolidation. Electronically Signed   By: Ernie AvenaPalani  Rathinasamy M.D.   On: 08/28/2022 09:44    Procedures Procedures    Medications Ordered in ED Medications  sodium chloride 0.9 % bolus 1,000 mL (0 mLs Intravenous Stopped 08/29/22 0620)  ipratropium-albuterol (DUONEB) 0.5-2.5 (3) MG/3ML nebulizer solution 3 mL (3 mLs Nebulization Given 08/29/22 0348)   iohexol (OMNIPAQUE) 350 MG/ML injection 50 mL (50 mLs Intravenous Contrast Given 08/29/22 0430)  diazepam (VALIUM) injection 5 mg (5 mg Intravenous Given 08/29/22 0617)    ED Course/ Medical Decision Making/ A&P  Medical Decision Making Amount and/or Complexity of Data Reviewed Radiology: ordered.  Risk Prescription drug management.   This patient presents to the ED with chief complaint(s) of apnea chest tightness with pertinent past medical history of BPD which further complicates the presenting complaint. The complaint involves an extensive differential diagnosis and also carries with it a high risk of complications and morbidity.    The differential diagnosis includes but not limited to   In my evaluation of this patient's dyspnea my DDx includes, but is not limited to, pneumonia, pulmonary embolism, pneumothorax, pulmonary edema, metabolic acidosis, asthma, COPD, cardiac cause, anemia, anxiety, etc.   Differential includes all life-threatening causes for chest pain. This includes but is not exclusive to acute coronary syndrome, aortic dissection, pulmonary embolism, cardiac tamponade, community-acquired pneumonia, pericarditis, musculoskeletal chest wall pain, etc.  . Serious etiologies were considered.   The initial plan is to screening labs/xr ordered in triage   Additional history obtained: Additional history obtained from  na Records reviewed  prior ED notes, prior labs and imaging, care documentation home medications   Independent labs interpretation:  The following labs were independently interpreted:  Troponin negative x2, BMP stable. BC with elevated hemoglobin 18.2, concern for hemoconcentration.  Will give IV fluids.  No baseline available for review  Independent visualization of imaging: - I independently visualized the following imaging with scope of interpretation limited to determining acute life threatening conditions related to  emergency care: c X-ray, CT PE, which revealed chest x-ray unremarkable, CT PE w/o abnormality no PE  Cardiac monitoring was reviewed and interpreted by myself which shows NSR  Treatment and Reassessment: IVF Valium >> improved greatly  Consultation: - Consulted or discussed management/test interpretation w/ external professional: na  Consideration for admission or further workup: Admission was considered    Well appearing 61 yo male to ED w/ DIB, vague chest discomfort. Overall well appearing. Exam stable, labs stable. Workup stable today. Pain improved with valium, favor likely some anxiety componet to his discomfort. Will give course of ativan as prn for home, advised to f/u with pcp and psychiatry team, f/u with cardiology per referral by PCP     The patient's chest tightness/discomfort is not suggestive of cardiac ischemia, aortic dissection, pericarditis, myocarditis, pulmonary embolism, pneumothorax, pneumonia, Zoster, or esophageal perforation, or other serious etiology.  Historically not abrupt in onset, tearing or ripping, pulses symmetric. EKG nonspecific for ischemia/infarction. No dysrhythmias, brugada, WPW, prolonged QT noted.   Troponin negative x2. CXR reviewed. Labs without demonstration of acute pathology unless otherwise noted above. Low HEART Score: 0-3 points (0.9-1.7% risk of MACE).  The patient improved significantly and was discharged in stable condition. Detailed discussions were had with the patient regarding current findings, and need for close f/u with PCP or on call doctor. The patient has been instructed to return immediately if the symptoms worsen in any way for re-evaluation. Patient verbalized understanding and is in agreement with current care plan. All questions answered prior to discharge.    Social Determinants of health: Social History   Tobacco Use   Smoking status: Never   Smokeless tobacco: Never  Vaping Use   Vaping Use: Never used   Substance Use Topics   Alcohol use: No   Drug use: No            Final Clinical Impression(s) / ED Diagnoses Final diagnoses:  Dyspnea, unspecified type  Anxiousness    Rx / DC Orders ED Discharge Orders  Ordered    LORazepam (ATIVAN) 1 MG tablet  3 times daily PRN        08/29/22 0759              Sloan Leiter, DO 08/29/22 [phone]

## 2022-08-31 ENCOUNTER — Ambulatory Visit: Payer: Medicare Other

## 2022-09-03 ENCOUNTER — Ambulatory Visit: Payer: Medicare Other

## 2022-09-05 ENCOUNTER — Telehealth: Payer: Self-pay | Admitting: Oncology

## 2022-09-05 NOTE — Telephone Encounter (Signed)
Attempted to contact patient to schedule from referral, no answer so voicemail was left for patient to call  back

## 2022-09-07 ENCOUNTER — Ambulatory Visit: Payer: Medicare Other

## 2022-09-10 ENCOUNTER — Ambulatory Visit: Payer: Medicare Other | Attending: Family Medicine

## 2022-09-10 DIAGNOSIS — R262 Difficulty in walking, not elsewhere classified: Secondary | ICD-10-CM | POA: Diagnosis present

## 2022-09-10 DIAGNOSIS — M6281 Muscle weakness (generalized): Secondary | ICD-10-CM | POA: Diagnosis present

## 2022-09-10 DIAGNOSIS — M25552 Pain in left hip: Secondary | ICD-10-CM

## 2022-09-10 NOTE — Therapy (Signed)
OUTPATIENT PHYSICAL THERAPY TREATMENT NOTE   Patient Name: Ryan Phelps MRN: [id number] DOB:Jul 29, 1961, 61 y.o., male Today's Date: 09/10/2022  PCP: Shon Hale, MD REFERRING PROVIDER: Alethia Berthold, MD  END OF SESSION:   PT End of Session - 09/10/22 1036     Visit Number 5    Number of Visits 17    Date for PT Re-Evaluation 10/08/22    Authorization Type Medicare    PT Start Time 1045    PT Stop Time 1125    PT Time Calculation (min) 40 min    Activity Tolerance Patient tolerated treatment well    Behavior During Therapy WFL for tasks assessed/performed              Past Medical History:  Diagnosis Date   Allergy    Bipolar 2 disorder (HCC)    Depression    GERD (gastroesophageal reflux disease)    Hyperlipidemia    Sleep apnea    History reviewed. No pertinent surgical history. There are no problems to display for this patient.   REFERRING DIAG: M25.552 (ICD-10-CM) - Pain in left hip  THERAPY DIAG: Pain in left hip - Plan: PT plan of care cert/re-cert   Muscle weakness (generalized) - Plan: PT plan of care cert/re-cert  Rationale for Evaluation and Treatment Rehabilitation  PERTINENT HISTORY: None  PRECAUTIONS: None  SUBJECTIVE: Pt presents to PT after a few weeks where he had symptoms of shortness of breath and significant fatigue. Went to ED on 08/28/2022, is awaiting a referral to cardiology. Pt does note some improvement in L hip pain. Is ready to begin PT treatment at this time.  PAIN:  Are you having pain?  Yes: NPRS scale: 4/10 Pain location: L hip Pain description: ache Aggravating factors: sitting, standing Relieving factors: position changes   OBJECTIVE: (objective measures completed at initial evaluation unless otherwise dated)   DIAGNOSTIC FINDINGS:            N/A   PATIENT SURVEYS:  FOTO: 52% function - 09/10/2022   COGNITION:           Overall cognitive status: Within functional limits for tasks assessed                           SENSATION: WFL   POSTURE: rounded shoulders, forward head, and medium body habitus   PALPATION: TTP to L piriformis, L glute med   LOWER EXTREMITY ROM:   Active ROM Right eval Left eval  Hip flexion      Hip extension      Hip abduction      Hip adduction      Hip internal rotation 35 25  Hip external rotation 30 20  Knee flexion      Knee extension      Ankle dorsiflexion      Ankle plantarflexion      Ankle inversion      Ankle eversion       (Blank rows = not tested)   LOWER EXTREMITY MMT:   MMT Right eval Left eval  Hip flexion 5/5 4/5  Hip extension      Hip abduction 5/5 4/5  Hip adduction 5/5 4/5  Hip internal rotation 5/5 4/5  Hip external rotation 5/5 4/5  Knee flexion      Knee extension      Ankle dorsiflexion      Ankle plantarflexion      Ankle inversion  Ankle eversion       (Blank rows = not tested)   LOWER EXTREMITY SPECIAL TESTS:  Hip special tests: Saralyn Pilar (FABER) test: negative and Hip scouring test: negative   FUNCTIONAL TESTS:  30 Second Sit to Stand: 8 reps; slight increase in pain   GAIT: Distance walked: 34ft Assistive device utilized: None Level of assistance: Complete Independence Comments: slight antalgic gait on L      TODAY'S TREATMENT: [facility] Adult PT Treatment:                                                DATE: 09/10/22 Therapeutic Exercise: Nustep L5 x 5 min while taking subjective Supine SLR 2x10 each Bridge 2x10 Supine hip fallout 2x10 GTB  Supine piriformis stretch 2x30 each Supine DKTC with physioball x 15  STS 3x5 Standing hip abd/ext 2x10 each  [facility] Adult PT Treatment:                                                DATE: 08/20/22 Therapeutic Exercise: Nustep L2  min 8 L piriformis stretch fig 4 30s x2 Bridge w/ball 15x DKTC w/ball (lower abdominals) Seated Hamstring stretch sitting 30sx2 Supine hip fallouts RTB 15x Bridge against RTB L clams RTB 15x SLR L 15x Curl ups  15x  [facility] Adult PT Treatment:                                                DATE: 08/17/22 Therapeutic Exercise: Nustep L4  min 8 L piriformis stretch fig 4 30s x2 Bridge w/ball 15x Seated Hamstring stretch sitting 30sx2 Supine hip fallouts RTB 15x L clams RTB 15x SLR L 15x Curl ups 15x Manual Therapy:  L piriformis release 8 min  [facility] Adult PT Treatment:                                                DATE: 08/13/2022 Therapeutic Exercise: Supine piriformis stretch x 30" L Bridge x 10 S/L clamshell x 5 GTB L   PATIENT EDUCATION:  Education details: HEP update Person educated: Patient Education method: Explanation, Demonstration, and Handouts Education comprehension: verbalized understanding and returned demonstration     HOME EXERCISE PROGRAM: Access Code: [email or web address] URL: [email or web address] Date: 09/10/2022 Prepared by: Octavio Manns  Exercises - Supine Piriformis Stretch with Leg Straight  - 1 x daily - 7 x weekly - 2-3 reps - 30-60 sec hold - Supine Bridge  - 1 x daily - 7 x weekly - 3 sets - 10 reps - 3 sec hold - Clamshell with Resistance  - 1 x daily - 7 x weekly - 3 sets - 10 reps - green theraband hold - Supine Piriformis Stretch with Foot on Ground  - 2 x daily - 7 x weekly - 1 sets - 3 reps - 30s hold - Standing Hip Abduction with Counter Support  - 1 x daily - 7 x weekly - 2-3 sets - 10 reps -  Standing Hip Extension with Counter Support  - 1 x daily - 7 x weekly - 2-3 sets - 10 reps   ASSESSMENT:   CLINICAL IMPRESSION: Pt was able to complete all prescribed exercises with no adverse effect or increase in pain. Therapy today focused on improving proximal hip strength in order to improve comfort and functional ability. He continues to benefit from skilled PT services, will continue to be seen and progressed as able per POC.   OBJECTIVE IMPAIRMENTS decreased activity tolerance, decreased balance, decreased mobility, decreased ROM, decreased strength,  and pain.    ACTIVITY LIMITATIONS standing, squatting, and stairs   PARTICIPATION LIMITATIONS: community activity, yard work, and recreational exercise   PERSONAL FACTORS Time since onset of injury/illness/exacerbation are also affecting patient's functional outcome.      GOALS: Goals reviewed with patient? No   SHORT TERM GOALS: Target date: 09/03/2022  Pt will be compliant and knowledgeable with initial HEP for improved comfort and carryover Baseline: initial HEP given  Goal status: MET   2.  Pt will self report left hip pain no greater than 5/10 for improved comfort and functional ability Baseline: 7/10 at worst Goal status: MET   LONG TERM GOALS: Target date: 10/08/2022    Pt will self report left hip pain no greater than 5/10 for improved comfort and functional ability Baseline: 7/10 at worst Goal status: ONGOING   2.  Pt will increase 30 Second Sit to Stand rep count to no less than 10 reps for improved balance, strength, and functional mobility Baseline: 8 reps; slight increase in pain Goal status: IONGOING    3.  Pt will improve FOTO function score to no less than 63% as proxy for functional improvement Baseline: 55% function 09/10/2022: 52% function Goal status: ONGOING    4.  Pt will increase L hip MMT to no less than 5/5 for all tested motions for improved comfort and functional ability  Baseline: see chart Goal status: INITIAL   PLAN: PT FREQUENCY: 1-2x/week   PT DURATION: 8 weeks   PLANNED INTERVENTIONS: Therapeutic exercises, Therapeutic activity, Neuromuscular re-education, Balance training, Gait training, Patient/Family education, Self Care, Joint mobilization, Dry Needling, Electrical stimulation, Cryotherapy, Moist heat, Manual therapy, and Re-evaluation   PLAN FOR NEXT SESSION: assess HEP response, progress proximal hip strength, f/u on symptom increase from previous session   Ward Chatters, PT 09/10/2022, 11:46 AM

## 2022-09-13 ENCOUNTER — Ambulatory Visit: Payer: Medicare Other

## 2022-09-13 DIAGNOSIS — M6281 Muscle weakness (generalized): Secondary | ICD-10-CM | POA: Diagnosis not present

## 2022-09-13 DIAGNOSIS — R262 Difficulty in walking, not elsewhere classified: Secondary | ICD-10-CM

## 2022-09-13 DIAGNOSIS — M25552 Pain in left hip: Secondary | ICD-10-CM

## 2022-09-13 NOTE — Therapy (Signed)
OUTPATIENT PHYSICAL THERAPY TREATMENT NOTE   Patient Name: Ryan Phelps MRN: [id number] DOB:Sep 26, 1961, 61 y.o., male Today's Date: 09/13/2022  PCP: Glenis Smoker, MD REFERRING PROVIDER: Nada Boozer, MD  END OF SESSION:   PT End of Session - 09/13/22 1210     Visit Number 6    Number of Visits 17    Date for PT Re-Evaluation 10/08/22    Authorization Type Medicare    PT Start Time 1215    PT Stop Time 1255    PT Time Calculation (min) 40 min    Activity Tolerance Patient tolerated treatment well    Behavior During Therapy WFL for tasks assessed/performed              Past Medical History:  Diagnosis Date   Allergy    Bipolar 2 disorder ([place])    Depression    GERD (gastroesophageal reflux disease)    Hyperlipidemia    Sleep apnea    History reviewed. No pertinent surgical history. There are no problems to display for this patient.   REFERRING DIAG: M25.552 (ICD-10-CM) - Pain in left hip  THERAPY DIAG: Pain in left hip - Plan: PT plan of care cert/re-cert   Muscle weakness (generalized) - Plan: PT plan of care cert/re-cert  Rationale for Evaluation and Treatment Rehabilitation  PERTINENT HISTORY: None  PRECAUTIONS: None  SUBJECTIVE: Reports improving pain levels 4/10.  Symptoms remain deep to L hip, worse with standing, symptoms due not extend past knee   PAIN:  Are you having pain?  Yes: NPRS scale: 4/10 Pain location: L hip Pain description: ache Aggravating factors: sitting, standing Relieving factors: position changes   OBJECTIVE: (objective measures completed at initial evaluation unless otherwise dated)   DIAGNOSTIC FINDINGS:            N/A   PATIENT SURVEYS:  FOTO: 52% function - 09/10/2022   COGNITION:           Overall cognitive status: Within functional limits for tasks assessed                          SENSATION: WFL   POSTURE: rounded shoulders, forward head, and medium body habitus   PALPATION: TTP to L  piriformis, L glute med   LOWER EXTREMITY ROM:   Active ROM Right eval Left eval  Hip flexion      Hip extension      Hip abduction      Hip adduction      Hip internal rotation 35 25  Hip external rotation 30 20  Knee flexion      Knee extension      Ankle dorsiflexion      Ankle plantarflexion      Ankle inversion      Ankle eversion       (Blank rows = not tested)   LOWER EXTREMITY MMT:   MMT Right eval Left eval  Hip flexion 5/5 4/5  Hip extension      Hip abduction 5/5 4/5  Hip adduction 5/5 4/5  Hip internal rotation 5/5 4/5  Hip external rotation 5/5 4/5  Knee flexion      Knee extension      Ankle dorsiflexion      Ankle plantarflexion      Ankle inversion      Ankle eversion       (Blank rows = not tested)   LOWER EXTREMITY SPECIAL TESTS:  Hip special  tests: Saralyn Pilar [facility]) test: negative and Hip scouring test: negative   FUNCTIONAL TESTS:  30 Second Sit to Stand: 8 reps; slight increase in pain   GAIT: Distance walked: 83ft Assistive device utilized: None Level of assistance: Complete Independence Comments: slight antalgic gait on L      TODAY'S TREATMENT: [facility] Adult PT Treatment:                                                DATE: 09/13/22 Therapeutic Exercise: Nustep L4 x 8 min  Supine SLR 2# 15x B Bridge with 2000g ball 15x Supine piriformis stretch 2x30s L Supine marching 2# 15/15 Curl ups 15x STS w/OH press from airex pad Side lie abduction 2# 15/15 Side lie clams GTB 15/15 Supine hip fallouts GTB 15x POE 30s Prone press 5x followed by 5x w/OP  [facility] Adult PT Treatment:                                                DATE: 09/10/22 Therapeutic Exercise: Nustep L5 x 5 min while taking subjective Supine SLR 2x10 each Bridge 2x10 Supine hip fallout 2x10 GTB  Supine piriformis stretch 2x30 each Supine DKTC with physioball x 15  STS 3x5 Standing hip abd/ext 2x10 each  [facility] Adult PT Treatment:                                                 DATE: 08/20/22 Therapeutic Exercise: Nustep L2  min 8 L piriformis stretch fig 4 30s x2 Bridge w/ball 15x DKTC w/ball (lower abdominals) Seated Hamstring stretch sitting 30sx2 Supine hip fallouts RTB 15x Bridge against RTB L clams RTB 15x SLR L 15x Curl ups 15x   PATIENT EDUCATION:  Education details: HEP update Person educated: Patient Education method: Explanation, Demonstration, and Handouts Education comprehension: verbalized understanding and returned demonstration     HOME EXERCISE PROGRAM: Access Code: [email or web address] URL: [email or web address] Date: 09/13/2022 Prepared by: Sharlynn Oliphant  Exercises - Supine Bridge  - 1 x daily - 3 x weekly - 3 sets - 10 reps - 3 sec hold - Clamshell with Resistance  - 1 x daily - 3 x weekly - 3 sets - 10 reps - green theraband hold - Supine Piriformis Stretch with Foot on Ground  - 2 x daily - 3 x weekly - 1 sets - 3 reps - 30s hold - Prone Press Up On Elbows  - 1 x daily - 3 x weekly - 1 sets - 1 reps - 120s hold   ASSESSMENT:   CLINICAL IMPRESSION: Pain intensity less, symptoms remain deep to L hip, worse with prolonged standing and weightbearing including ambulation.  Increased resistance as noted to focus on L hip strengthening.  HEP reviewed and added to, addressing decreased spinal mobility.  OBJECTIVE IMPAIRMENTS decreased activity tolerance, decreased balance, decreased mobility, decreased ROM, decreased strength, and pain.    ACTIVITY LIMITATIONS standing, squatting, and stairs   PARTICIPATION LIMITATIONS: community activity, yard work, and recreational exercise   PERSONAL FACTORS Time since onset of injury/illness/exacerbation are also affecting patient's functional outcome.  GOALS: Goals reviewed with patient? No   SHORT TERM GOALS: Target date: 09/03/2022  Pt will be compliant and knowledgeable with initial HEP for improved comfort and carryover Baseline: initial HEP given  Goal status: MET    2.  Pt will self report left hip pain no greater than 5/10 for improved comfort and functional ability Baseline: 7/10 at worst Goal status: MET   LONG TERM GOALS: Target date: 10/08/2022    Pt will self report left hip pain no greater than 5/10 for improved comfort and functional ability Baseline: 7/10 at worst Goal status: ONGOING   2.  Pt will increase 30 Second Sit to Stand rep count to no less than 10 reps for improved balance, strength, and functional mobility Baseline: 8 reps; slight increase in pain Goal status: IONGOING    3.  Pt will improve FOTO function score to no less than 63% as proxy for functional improvement Baseline: 55% function 09/10/2022: 52% function Goal status: ONGOING    4.  Pt will increase L hip MMT to no less than 5/5 for all tested motions for improved comfort and functional ability  Baseline: see chart Goal status: INITIAL   PLAN: PT FREQUENCY: 1-2x/week   PT DURATION: 8 weeks   PLANNED INTERVENTIONS: Therapeutic exercises, Therapeutic activity, Neuromuscular re-education, Balance training, Gait training, Patient/Family education, Self Care, Joint mobilization, Dry Needling, Electrical stimulation, Cryotherapy, Moist heat, Manual therapy, and Re-evaluation   PLAN FOR NEXT SESSION: assess HEP response, progress proximal hip strength, f/u on symptom increase from previous session   Lanice Shirts, PT 09/13/2022, 1:13 PM

## 2022-09-14 ENCOUNTER — Ambulatory Visit: Payer: Medicare Other

## 2022-09-17 ENCOUNTER — Ambulatory Visit: Payer: Medicare Other

## 2022-09-17 DIAGNOSIS — M6281 Muscle weakness (generalized): Secondary | ICD-10-CM | POA: Diagnosis not present

## 2022-09-17 DIAGNOSIS — R262 Difficulty in walking, not elsewhere classified: Secondary | ICD-10-CM

## 2022-09-17 DIAGNOSIS — M25552 Pain in left hip: Secondary | ICD-10-CM

## 2022-09-17 NOTE — Therapy (Signed)
OUTPATIENT PHYSICAL THERAPY TREATMENT NOTE   Patient Name: Ryan Phelps MRN: [id number] DOB:[birth year], 61 y.o., male Today's Date: 09/17/2022  PCP: Glenis Smoker, MD REFERRING PROVIDER: Nada Boozer, MD  END OF SESSION:   PT End of Session - 09/17/22 0932     Visit Number 7    Number of Visits 17    Date for PT Re-Evaluation 10/08/22    Authorization Type Medicare    PT Start Time 1000    PT Stop Time 5397    PT Time Calculation (min) 40 min    Activity Tolerance Patient tolerated treatment well    Behavior During Therapy WFL for tasks assessed/performed               Past Medical History:  Diagnosis Date   Allergy    Bipolar 2 disorder ([place])    Depression    GERD (gastroesophageal reflux disease)    Hyperlipidemia    Sleep apnea    History reviewed. No pertinent surgical history. There are no problems to display for this patient.   REFERRING DIAG: M25.552 (ICD-10-CM) - Pain in left hip  THERAPY DIAG: Pain in left hip - Plan: PT plan of care cert/re-cert   Muscle weakness (generalized) - Plan: PT plan of care cert/re-cert  Rationale for Evaluation and Treatment Rehabilitation  PERTINENT HISTORY: None  PRECAUTIONS: None  SUBJECTIVE: Pt presents to PT with reports of decreased L hip pain. Has been compliant with HEP with no adverse effect. Pt is ready to begin PT at this time.   PAIN:  Are you having pain?  Yes: NPRS scale: 3/10 Pain location: L hip Pain description: ache Aggravating factors: sitting, standing Relieving factors: position changes   OBJECTIVE: (objective measures completed at initial evaluation unless otherwise dated)   DIAGNOSTIC FINDINGS:            N/A   PATIENT SURVEYS:  FOTO: 52% function - 09/10/2022   COGNITION:           Overall cognitive status: Within functional limits for tasks assessed                          SENSATION: WFL   POSTURE: rounded shoulders, forward head, and medium body habitus    PALPATION: TTP to L piriformis, L glute med   LOWER EXTREMITY ROM:   Active ROM Right eval Left eval  Hip flexion      Hip extension      Hip abduction      Hip adduction      Hip internal rotation 35 25  Hip external rotation 30 20  Knee flexion      Knee extension      Ankle dorsiflexion      Ankle plantarflexion      Ankle inversion      Ankle eversion       (Blank rows = not tested)   LOWER EXTREMITY MMT:   MMT Right eval Left eval  Hip flexion 5/5 4/5  Hip extension      Hip abduction 5/5 4/5  Hip adduction 5/5 4/5  Hip internal rotation 5/5 4/5  Hip external rotation 5/5 4/5  Knee flexion      Knee extension      Ankle dorsiflexion      Ankle plantarflexion      Ankle inversion      Ankle eversion       (Blank rows = not tested)  LOWER EXTREMITY SPECIAL TESTS:  Hip special tests: Saralyn Pilar (FABER) test: negative and Hip scouring test: negative   FUNCTIONAL TESTS:  30 Second Sit to Stand: 8 reps; slight increase in pain   GAIT: Distance walked: 69f Assistive device utilized: None Level of assistance: Complete Independence Comments: slight antalgic gait on L      TODAY'S TREATMENT: [facility] Adult PT Treatment:                                                DATE: 09/17/22 Therapeutic Exercise: Nustep L5 x 5 min while taking subjective STS 2x10 - high table no UE Modified Jordanny stretch L x 60" Supine SLR 2x10 2#  Bridge 2x15 Single leg bridge fig 4 2x10 Supine piriformis stretch 2x30s L Side lie clams GTB 15/15 POE 30s Prone press x 10 Standing hip abd/ext 2x10 25#  [facility] Adult PT Treatment:                                                DATE: 09/13/22 Therapeutic Exercise: Nustep L4 x 8 min  Supine SLR 2# 15x B Bridge with 2000g ball 15x Supine piriformis stretch 2x30s L Supine marching 2# 15/15 Curl ups 15x STS w/OH press from airex pad Side lie abduction 2# 15/15 Side lie clams GTB 15/15 Supine hip fallouts GTB 15x POE 30s Prone  press 5x followed by 5x w/OP  [facility]                                                [facility]: [facility] Therapeutic Exercise: Nustep L5 x 5 min while taking subjective Supine SLR 2x10 each Bridge 2x10 Supine hip fallout 2x10 GTB  Supine piriformis stretch 2x30 each Supine DKTC with physioball x 15  STS 3x5 Standing hip abd/ext 2x10 each  [facility] Adult PT Treatment:                                                DATE: 08/20/22 Therapeutic Exercise: Nustep L2  min 8 L piriformis stretch fig 4 30s x2 Bridge w/ball 15x DKTC w/ball (lower abdominals) Seated Hamstring stretch sitting 30sx2 Supine hip fallouts RTB 15x Bridge against RTB L clams RTB 15x SLR L 15x Curl ups 15x   PATIENT EDUCATION:  Education details: HEP update Person educated: Patient Education method: Explanation, Demonstration, and Handouts Education comprehension: verbalized understanding and returned demonstration     HOME EXERCISE PROGRAM: Access Code: [email or web address] URL: [email or web address] Date: 09/13/2022 Prepared by: JSharlynn Oliphant Exercises - Supine Bridge  - 1 x daily - 3 x weekly - 3 sets - 10 reps - 3 sec hold - Clamshell with Resistance  - 1 x daily - 3 x weekly - 3 sets - 10 reps - green theraband hold - Supine Piriformis Stretch with Foot on Ground  - 2 x daily - 3 x weekly - 1 sets - 3 reps - 30s hold - Prone Press Up On  Elbows  - 1 x daily - 3 x weekly - 1 sets - 1 reps - 120s hold   ASSESSMENT:   CLINICAL IMPRESSION: Pt was able to complete all prescribed exercises with no adverse effect or increase in pain. Therapy focused on improving proximal hip strength and general mobility. He demonstrated improved tolerance to activity today, is progressing as expected. Will continue per POC as prescribed.   OBJECTIVE IMPAIRMENTS decreased activity tolerance, decreased balance, decreased mobility, decreased ROM, decreased strength, and pain.    ACTIVITY LIMITATIONS standing,  squatting, and stairs   PARTICIPATION LIMITATIONS: community activity, yard work, and recreational exercise   PERSONAL FACTORS Time since onset of injury/illness/exacerbation are also affecting patient's functional outcome.      GOALS: Goals reviewed with patient? No   SHORT TERM GOALS: Target date: 09/03/2022  Pt will be compliant and knowledgeable with initial HEP for improved comfort and carryover Baseline: initial HEP given  Goal status: MET   2.  Pt will self report left hip pain no greater than 5/10 for improved comfort and functional ability Baseline: 7/10 at worst Goal status: MET   LONG TERM GOALS: Target date: 10/08/2022    Pt will self report left hip pain no greater than 5/10 for improved comfort and functional ability Baseline: 7/10 at worst Goal status: ONGOING   2.  Pt will increase 30 Second Sit to Stand rep count to no less than 10 reps for improved balance, strength, and functional mobility Baseline: 8 reps; slight increase in pain Goal status: IONGOING    3.  Pt will improve FOTO function score to no less than 63% as proxy for functional improvement Baseline: 55% function 09/10/2022: 52% function Goal status: ONGOING    4.  Pt will increase L hip MMT to no less than 5/5 for all tested motions for improved comfort and functional ability  Baseline: see chart Goal status: INITIAL   PLAN: PT FREQUENCY: 1-2x/week   PT DURATION: 8 weeks   PLANNED INTERVENTIONS: Therapeutic exercises, Therapeutic activity, Neuromuscular re-education, Balance training, Gait training, Patient/Family education, Self Care, Joint mobilization, Dry Needling, Electrical stimulation, Cryotherapy, Moist heat, Manual therapy, and Re-evaluation   PLAN FOR NEXT SESSION: assess HEP response, progress proximal hip strength, f/u on symptom increase from previous session   Ward Chatters, PT 09/17/2022, 10:41 AM

## 2022-09-19 ENCOUNTER — Inpatient Hospital Stay: Payer: Medicare Other | Admitting: Nurse Practitioner

## 2022-09-19 ENCOUNTER — Inpatient Hospital Stay: Payer: Medicare Other

## 2022-09-21 ENCOUNTER — Ambulatory Visit: Payer: Medicare Other

## 2022-09-24 ENCOUNTER — Ambulatory Visit: Payer: Medicare Other

## 2022-09-28 ENCOUNTER — Ambulatory Visit: Payer: Medicare Other | Attending: Family Medicine

## 2022-09-28 DIAGNOSIS — R262 Difficulty in walking, not elsewhere classified: Secondary | ICD-10-CM | POA: Insufficient documentation

## 2022-09-28 DIAGNOSIS — M25552 Pain in left hip: Secondary | ICD-10-CM | POA: Diagnosis present

## 2022-09-28 DIAGNOSIS — M6281 Muscle weakness (generalized): Secondary | ICD-10-CM | POA: Diagnosis not present

## 2022-09-28 NOTE — Therapy (Signed)
OUTPATIENT PHYSICAL THERAPY TREATMENT NOTE   Patient Name: Ryan Phelps MRN: [id number] DOB:[birth year], 61 y.o., male Today's Date: 09/28/2022  PCP: Glenis Smoker, MD REFERRING PROVIDER: Nada Boozer, MD  END OF SESSION:   PT End of Session - 09/28/22 1002     Visit Number 8    Number of Visits 17    Date for PT Re-Evaluation 10/08/22    Authorization Type Medicare    PT Start Time 1000    PT Stop Time 1040    PT Time Calculation (min) 40 min    Activity Tolerance Patient tolerated treatment well    Behavior During Therapy WFL for tasks assessed/performed               Past Medical History:  Diagnosis Date   Allergy    Bipolar 2 disorder ([place])    Depression    GERD (gastroesophageal reflux disease)    Hyperlipidemia    Sleep apnea    History reviewed. No pertinent surgical history. There are no problems to display for this patient.   REFERRING DIAG: M25.552 (ICD-10-CM) - Pain in left hip  THERAPY DIAG: Pain in left hip - Plan: PT plan of care cert/re-cert   Muscle weakness (generalized) - Plan: PT plan of care cert/re-cert  Rationale for Evaluation and Treatment Rehabilitation  PERTINENT HISTORY: None  PRECAUTIONS: None  SUBJECTIVE: Symptoms remain at 3/10 intensity and have localized to deep in L hip joint.  PAIN:  Are you having pain?  Yes: NPRS scale: 3/10 Pain location: L hip Pain description: ache Aggravating factors: sitting, standing Relieving factors: position changes   OBJECTIVE: (objective measures completed at initial evaluation unless otherwise dated)   DIAGNOSTIC FINDINGS:            N/A   PATIENT SURVEYS:  FOTO: 52% function - 09/10/2022   COGNITION:           Overall cognitive status: Within functional limits for tasks assessed                          SENSATION: WFL   POSTURE: rounded shoulders, forward head, and medium body habitus   PALPATION: TTP to L piriformis, L glute med   LOWER EXTREMITY  ROM:   Active ROM Right eval Left eval  Hip flexion      Hip extension      Hip abduction      Hip adduction      Hip internal rotation 35 25  Hip external rotation 30 20  Knee flexion      Knee extension      Ankle dorsiflexion      Ankle plantarflexion      Ankle inversion      Ankle eversion       (Blank rows = not tested)   LOWER EXTREMITY MMT:   MMT Right eval Left eval  Hip flexion 5/5 4/5  Hip extension      Hip abduction 5/5 4/5  Hip adduction 5/5 4/5  Hip internal rotation 5/5 4/5  Hip external rotation 5/5 4/5  Knee flexion      Knee extension      Ankle dorsiflexion      Ankle plantarflexion      Ankle inversion      Ankle eversion       (Blank rows = not tested)   LOWER EXTREMITY SPECIAL TESTS:  Hip special tests: Saralyn Pilar (FABER) test: negative and Hip  scouring test: negative   FUNCTIONAL TESTS:  30 Second Sit to Stand: 8 reps; slight increase in pain   GAIT: Distance walked: 57f Assistive device utilized: None Level of assistance: Complete Independence Comments: slight antalgic gait on L      TODAY'S TREATMENT: [facility] Adult PT Treatment:                                                DATE: 09/28/22 Therapeutic Exercise: Nustep L6 x 8 min STS 2x10 10# KB from Airex pad Modified Yosef stretch L 30s x2 Supine SLR 2x15 3#  Bridge w/ball 15x Bridge against BluTB 15x Single leg bridge fig 4 15x B Supine piriformis stretch 2x30s L push technique Side lie clams BluTB L 15x2 Supine hip fallouts BluTB Prone press 10x  PA mobs L5-1 Grade III 10x.   [place]                                                DATE: 09/17/22 Therapeutic Exercise: Nustep L5 x 5 min while taking subjective STS 2x10 - high table no UE Modified Oseph stretch L 60" Supine SLR 2x10 2#  Bridge 2x15 Single leg bridge fig 4 2x10 Supine piriformis stretch 2x30s L Side lie clams GTB 15/15 POE 30s Prone press x 10 Standing hip abd/ext 2x10 25#  [facility]  Adult PT Treatment:                                                DATE: 09/13/22 Therapeutic Exercise: Nustep L4 x 8 min  Supine SLR 2# 15x B Bridge with 2000g ball 15x Supine piriformis stretch 2x30s L Supine marching 2# 15/15 Curl ups 15x STS w/OH press from airex pad Side lie abduction 2# 15/15 Side lie clams GTB 15/15 Supine hip fallouts GTB 15x POE 30s Prone press 5x followed by 5x w/OP    PATIENT EDUCATION:  Education details: HEP update Person educated: Patient Education method: Explanation, Demonstration, and Handouts Education comprehension: verbalized understanding and returned demonstration     HOME EXERCISE PROGRAM: Access Code: [email or web address] URL: [email or web address] Date: 09/13/2022 Prepared by: JSharlynn Oliphant Exercises - Supine Bridge  - 1 x daily - 3 x weekly - 3 sets - 10 reps - 3 sec hold - Clamshell with Resistance  - 1 x daily - 3 x weekly - 3 sets - 10 reps - green theraband hold - Supine Piriformis Stretch with Foot on Ground  - 2 x daily - 3 x weekly - 1 sets - 3 reps - 30s hold - Prone Press Up On Elbows  - 1 x daily - 3 x weekly - 1 sets - 1 reps - 120s hold   ASSESSMENT:   CLINICAL IMPRESSION: Pt was able to complete all prescribed exercises with no adverse effect or increase in pain. Therapy focused on improving proximal hip strength and general mobility. He demonstrated improved tolerance to activity today, is progressing as expected. Will continue per POC as prescribed.   OBJECTIVE IMPAIRMENTS decreased activity tolerance, decreased balance, decreased mobility, decreased ROM, decreased strength, and pain.  ACTIVITY LIMITATIONS standing, squatting, and stairs   PARTICIPATION LIMITATIONS: community activity, yard work, and recreational exercise   PERSONAL FACTORS Time since onset of injury/illness/exacerbation are also affecting patient's functional outcome.      GOALS: Goals reviewed with patient? No   SHORT TERM GOALS:  Target date: 09/03/2022  Pt will be compliant and knowledgeable with initial HEP for improved comfort and carryover Baseline: initial HEP given  Goal status: MET   2.  Pt will self report left hip pain no greater than 5/10 for improved comfort and functional ability Baseline: 7/10 at worst Goal status: MET   LONG TERM GOALS: Target date: 10/08/2022    Pt will self report left hip pain no greater than 5/10 for improved comfort and functional ability Baseline: 7/10 at worst Goal status: ONGOING   2.  Pt will increase 30 Second Sit to Stand rep count to no less than 10 reps for improved balance, strength, and functional mobility Baseline: 8 reps; slight increase in pain Goal status: IONGOING    3.  Pt will improve FOTO function score to no less than 63% as proxy for functional improvement Baseline: 55% function 09/10/2022: 52% function Goal status: ONGOING    4.  Pt will increase L hip MMT to no less than 5/5 for all tested motions for improved comfort and functional ability  Baseline: see chart Goal status: INITIAL   PLAN: PT FREQUENCY: 1-2x/week   PT DURATION: 8 weeks   PLANNED INTERVENTIONS: Therapeutic exercises, Therapeutic activity, Neuromuscular re-education, Balance training, Gait training, Patient/Family education, Self Care, Joint mobilization, Dry Needling, Electrical stimulation, Cryotherapy, Moist heat, Manual therapy, and Re-evaluation   PLAN FOR NEXT SESSION: assess HEP response, progress proximal hip strength, f/u on symptom increase from previous session   Lanice Shirts, PT 09/28/2022, 10:47 AM

## 2022-10-01 ENCOUNTER — Ambulatory Visit: Payer: Medicare Other

## 2022-10-01 DIAGNOSIS — M6281 Muscle weakness (generalized): Secondary | ICD-10-CM | POA: Diagnosis not present

## 2022-10-01 DIAGNOSIS — R262 Difficulty in walking, not elsewhere classified: Secondary | ICD-10-CM

## 2022-10-01 DIAGNOSIS — M25552 Pain in left hip: Secondary | ICD-10-CM

## 2022-10-01 NOTE — Therapy (Signed)
OUTPATIENT PHYSICAL THERAPY TREATMENT NOTE   Patient Name: Ryan Phelps MRN: [id number] DOB:[birth year], 61 y.o., male Today's Date: 10/01/2022  PCP: Glenis Smoker, MD REFERRING PROVIDER: Nada Boozer, MD  END OF SESSION:   PT End of Session - 10/01/22 0947     Visit Number 9    Number of Visits 17    Date for PT Re-Evaluation 10/08/22    Authorization Type Medicare    PT Start Time 1000    PT Stop Time 1040    PT Time Calculation (min) 40 min    Activity Tolerance Patient tolerated treatment well    Behavior During Therapy WFL for tasks assessed/performed               Past Medical History:  Diagnosis Date   Allergy    Bipolar 2 disorder ([place])    Depression    GERD (gastroesophageal reflux disease)    Hyperlipidemia    Sleep apnea    History reviewed. No pertinent surgical history. There are no problems to display for this patient.   REFERRING DIAG: M25.552 (ICD-10-CM) - Pain in left hip  THERAPY DIAG: Pain in left hip - Plan: PT plan of care cert/re-cert   Muscle weakness (generalized) - Plan: PT plan of care cert/re-cert  Rationale for Evaluation and Treatment Rehabilitation  PERTINENT HISTORY: None  PRECAUTIONS: None  SUBJECTIVE: Describes an all over soreness.  Hip pain unchanged, still limits prolonged standing and walking.  Does not relate any relieving factors other than time and he tends to work through it.  PAIN:  Are you having pain?  Yes: NPRS scale: 3/10 Pain location: L hip Pain description: ache Aggravating factors: sitting, standing Relieving factors: position changes   OBJECTIVE: (objective measures completed at initial evaluation unless otherwise dated)   DIAGNOSTIC FINDINGS:            N/A   PATIENT SURVEYS:  FOTO: 52% function - 09/10/2022   COGNITION:           Overall cognitive status: Within functional limits for tasks assessed                          SENSATION: WFL   POSTURE: rounded shoulders,  forward head, and medium body habitus   PALPATION: TTP to L piriformis, L glute med   LOWER EXTREMITY ROM:   Active ROM Right eval Left eval  Hip flexion      Hip extension      Hip abduction      Hip adduction      Hip internal rotation 35 25  Hip external rotation 30 20  Knee flexion      Knee extension      Ankle dorsiflexion      Ankle plantarflexion      Ankle inversion      Ankle eversion       (Blank rows = not tested)   LOWER EXTREMITY MMT:   MMT Right eval Left eval  Hip flexion 5/5 4/5  Hip extension      Hip abduction 5/5 4/5  Hip adduction 5/5 4/5  Hip internal rotation 5/5 4/5  Hip external rotation 5/5 4/5  Knee flexion      Knee extension      Ankle dorsiflexion      Ankle plantarflexion      Ankle inversion      Ankle eversion       (Blank rows =  not tested)   LOWER EXTREMITY SPECIAL TESTS:  Hip special tests: Saralyn Pilar (FABER) test: negative and Hip scouring test: negative   FUNCTIONAL TESTS:  30 Second Sit to Stand: 8 reps; slight increase in pain   GAIT: Distance walked: 41f Assistive device utilized: None Level of assistance: Complete Independence Comments: slight antalgic gait on L      TODAY'S TREATMENT: [facility] Adult PT Treatment:                                                DATE: 10/01/22 Therapeutic Exercise: Nustep L6 x 8 min Open book 10/10 STS 2x10 10# KB from Airex pad Modified Aziah stretch L 30s x2 Supine SLR 2x15 4#  Bridge w/ball 15x Bridge against BluTB 15x Single leg bridge fig 4 15x B Supine piriformis stretch 2x30s L push technique Side lie clams BluTB L 15x2 Supine hip fallouts BluTB 15x R sidelie abducton in extension 15x2 Prone press 10x   [facility] Adult PT Treatment:                                                DATE: 09/28/22 Therapeutic Exercise: Nustep L6 x 8 min STS 2x10 10# KB from Airex pad Modified Romulo stretch L 30s x2 Supine SLR 2x15 3#  Bridge w/ball 15x Bridge against BluTB 15x Single leg  bridge fig 4 15x B Supine piriformis stretch 2x30s L push technique Side lie clams BluTB L 15x2 Supine hip fallouts BluTB Prone press 10x  PA mobs L5-1 Grade III 10x.   [facility]                                                DATE: 09/17/22 Therapeutic Exercise: Nustep L5 x 5 min while taking subjective STS 2x10 - high table no UE Modified Marbin stretch L 60" Supine SLR 2x10 2#  Bridge 2x15 Single leg bridge fig 4 2x10 Supine piriformis stretch 2x30s L Side lie clams GTB 15/15 POE 30s Prone press x 10 Standing hip abd/ext 2x10 25#      PATIENT EDUCATION:  Education details: HEP update Person educated: Patient Education method: Explanation, Demonstration, and Handouts Education comprehension: verbalized understanding and returned demonstration     HOME EXERCISE PROGRAM: Access Code: [email or web address] URL: [email or web address] Date: 10/01/2022 Prepared by: JSharlynn Oliphant Exercises - Supine Bridge  - 1 x daily - 3 x weekly - 3 sets - 10 reps - 3 sec hold - Clamshell with Resistance  - 1 x daily - 3 x weekly - 3 sets - 10 reps - green theraband hold - Supine Piriformis Stretch with Foot on Ground  - 2 x daily - 3 x weekly - 1 sets - 3 reps - 30s hold - Prone Press Up  - 1 x daily - 3 x weekly - 3 sets - 10 reps - Sidelying Open Book Thoracic Lumbar Rotation and Extension  - 1 x daily - 3 x weekly - 3 sets - 10 reps   ASSESSMENT:   CLINICAL IMPRESSION: Today's session added weight and resistance as noted, increasing difficulty on  SLR and squatting tasks.  Assisted patient with sidelie abduction to bias gluteus medius and promote strength.  Added open book to address spinal stiffness.  OBJECTIVE IMPAIRMENTS decreased activity tolerance, decreased balance, decreased mobility, decreased ROM, decreased strength, and pain.    ACTIVITY LIMITATIONS standing, squatting, and stairs   PARTICIPATION LIMITATIONS: community activity, yard work, and  recreational exercise   PERSONAL FACTORS Time since onset of injury/illness/exacerbation are also affecting patient's functional outcome.      GOALS: Goals reviewed with patient? No   SHORT TERM GOALS: Target date: 09/03/2022  Pt will be compliant and knowledgeable with initial HEP for improved comfort and carryover Baseline: initial HEP given  Goal status: MET   2.  Pt will self report left hip pain no greater than 5/10 for improved comfort and functional ability Baseline: 7/10 at worst Goal status: MET   LONG TERM GOALS: Target date: 10/08/2022    Pt will self report left hip pain no greater than 5/10 for improved comfort and functional ability Baseline: 7/10 at worst Goal status: ONGOING   2.  Pt will increase 30 Second Sit to Stand rep count to no less than 10 reps for improved balance, strength, and functional mobility Baseline: 8 reps; slight increase in pain Goal status: IONGOING    3.  Pt will improve FOTO function score to no less than 63% as proxy for functional improvement Baseline: 55% function 09/10/2022: 52% function Goal status: ONGOING    4.  Pt will increase L hip MMT to no less than 5/5 for all tested motions for improved comfort and functional ability  Baseline: see chart Goal status: INITIAL   PLAN: PT FREQUENCY: 1-2x/week   PT DURATION: 8 weeks   PLANNED INTERVENTIONS: Therapeutic exercises, Therapeutic activity, Neuromuscular re-education, Balance training, Gait training, Patient/Family education, Self Care, Joint mobilization, Dry Needling, Electrical stimulation, Cryotherapy, Moist heat, Manual therapy, and Re-evaluation   PLAN FOR NEXT SESSION: assess HEP response, progress proximal hip strength, f/u on symptom increase from previous session, re-assess for 10th session   Lanice Shirts, PT 10/01/2022, 10:48 AM

## 2022-10-05 ENCOUNTER — Ambulatory Visit: Payer: Medicare Other

## 2022-10-05 ENCOUNTER — Ambulatory Visit: Payer: Medicare Other | Admitting: Interventional Cardiology

## 2022-10-08 ENCOUNTER — Ambulatory Visit: Payer: Medicare Other

## 2022-10-08 ENCOUNTER — Encounter: Payer: Medicare Other | Admitting: Nurse Practitioner

## 2022-10-08 ENCOUNTER — Other Ambulatory Visit: Payer: Medicare Other

## 2022-10-08 DIAGNOSIS — M6281 Muscle weakness (generalized): Secondary | ICD-10-CM

## 2022-10-08 DIAGNOSIS — M25552 Pain in left hip: Secondary | ICD-10-CM

## 2022-10-08 NOTE — Therapy (Signed)
OUTPATIENT PHYSICAL THERAPY TREATMENT NOTE   Patient Name: Ryan Phelps MRN: [id number] DOB:[birth year], 61 y.o., male Today's Date: 10/08/2022  PCP: Glenis Smoker, MD REFERRING PROVIDER: Nada Boozer, MD  END OF SESSION:   PT End of Session - 10/08/22 1003     Visit Number 10    Number of Visits 17    Date for PT Re-Evaluation 10/08/22    Authorization Type Medicare    PT Start Time 1000    PT Stop Time 1040    PT Time Calculation (min) 40 min    Activity Tolerance Patient tolerated treatment well    Behavior During Therapy WFL for tasks assessed/performed               Past Medical History:  Diagnosis Date   Allergy    Bipolar 2 disorder ([place])    Depression    GERD (gastroesophageal reflux disease)    Hyperlipidemia    Sleep apnea    History reviewed. No pertinent surgical history. There are no problems to display for this patient.   REFERRING DIAG: M25.552 (ICD-10-CM) - Pain in left hip  THERAPY DIAG: Pain in left hip - Plan: PT plan of care cert/re-cert   Muscle weakness (generalized) - Plan: PT plan of care cert/re-cert  Rationale for Evaluation and Treatment Rehabilitation  PERTINENT HISTORY: None  PRECAUTIONS: None  SUBJECTIVE: hips feel sore after prolonged positions pain level 3-4 /10.  Low back and hips feel stiff with prolonged positions  PAIN:  Are you having pain?  Yes: NPRS scale: 3/10 Pain location: L hip Pain description: ache Aggravating factors: sitting, standing Relieving factors: position changes   OBJECTIVE: (objective measures completed at initial evaluation unless otherwise dated)   DIAGNOSTIC FINDINGS:            N/A   PATIENT SURVEYS:  FOTO: 52% function - 09/10/2022   COGNITION:           Overall cognitive status: Within functional limits for tasks assessed                          SENSATION: WFL   POSTURE: rounded shoulders, forward head, and medium body habitus   PALPATION: TTP to L  piriformis, L glute med   LOWER EXTREMITY ROM:   Active ROM Right eval Left eval  Hip flexion      Hip extension      Hip abduction      Hip adduction      Hip internal rotation 35 25  Hip external rotation 30 20  Knee flexion      Knee extension      Ankle dorsiflexion      Ankle plantarflexion      Ankle inversion      Ankle eversion       (Blank rows = not tested)   LOWER EXTREMITY MMT:   MMT Right eval Left eval  Hip flexion 5/5 4/5  Hip extension      Hip abduction 5/5 4/5  Hip adduction 5/5 4/5  Hip internal rotation 5/5 4/5  Hip external rotation 5/5 4/5  Knee flexion      Knee extension      Ankle dorsiflexion      Ankle plantarflexion      Ankle inversion      Ankle eversion       (Blank rows = not tested)   LOWER EXTREMITY SPECIAL TESTS:  Hip special tests:  Saralyn Pilar [facility]) test: negative and Hip scouring test: negative   FUNCTIONAL TESTS:  30 Second Sit to Stand: 8 reps; slight increase in pain   GAIT: Distance walked: 39f Assistive device utilized: None Level of assistance: Complete Independence Comments: slight antalgic gait on L      TODAY'S TREATMENT: [facility] Adult PT Treatment:                                                DATE: 10/08/22 Therapeutic Exercise: Nustep L6 x 8 min Open book 10/10 Supine SKTC 4# 15/15 Supine SLR 2x15 4#  Bridge w/ball 2000g 15x Bridge against BluTB 15x Side lie clams BluTB L 15x Supine hip fallouts BluTB 15x B sidelie abducton in extension 15x Prone prop 2 min  [facility] Adult PT Treatment:                                                DATE: 10/01/22 Therapeutic Exercise: Nustep L6 x 8 min Open book 10/10 STS 2x10 10# KB from Airex pad Modified Fawaz stretch L 30s x2 Supine SLR 2x15 4#  Bridge w/ball 15x Bridge against BluTB 15x Single leg bridge fig 4 15x B Supine piriformis stretch 2x30s L push technique Side lie clams BluTB L 15x2 Supine hip fallouts BluTB 15x R sidelie abducton in extension  15x2 Prone press 10x   [facility] Adult PT Treatment:                                                DATE: 09/28/22 Therapeutic Exercise: Nustep L6 x 8 min STS 2x10 10# KB from Airex pad Modified Higinio stretch L 30s x2 Supine SLR 2x15 3#  Bridge w/ball 15x Bridge against BluTB 15x Single leg bridge fig 4 15x B Supine piriformis stretch 2x30s L push technique Side lie clams BluTB L 15x2 Supine hip fallouts BluTB Prone press 10x  PA mobs L5-1 Grade III 10x.   [facility]                                                DATE: 09/17/22 Therapeutic Exercise: Nustep L5 x 5 min while taking subjective STS 2x10 - high table no UE Modified Darivs stretch L 60" Supine SLR 2x10 2#  Bridge 2x15 Single leg bridge fig 4 2x10 Supine piriformis stretch 2x30s L Side lie clams GTB 15/15 POE 30s Prone press x 10 Standing hip abd/ext 2x10 25#      PATIENT EDUCATION:  Education details: HEP update Person educated: Patient Education method: Explanation, Demonstration, and Handouts Education comprehension: verbalized understanding and returned demonstration     HOME EXERCISE PROGRAM: Access Code: [email or web address] URL: [email or web address] Date: 10/01/2022 Prepared by: JSharlynn Oliphant Exercises - Supine Bridge  - 1 x daily - 3 x weekly - 3 sets - 10 reps - 3 sec hold - Clamshell with Resistance  - 1 x daily - 3 x weekly - 3 sets - 10 reps -  green theraband hold - Supine Piriformis Stretch with Foot on Ground  - 2 x daily - 3 x weekly - 1 sets - 3 reps - 30s hold - Prone Press Up  - 1 x daily - 3 x weekly - 3 sets - 10 reps - Sidelying Open Book Thoracic Lumbar Rotation and Extension  - 1 x daily - 3 x weekly - 3 sets - 10 reps   ASSESSMENT:   CLINICAL IMPRESSION: Today's session focused on lumbar extension stretch, adding resistance to exercises as noted, incorporating additional abdominal and core tasks. Continued restrictions in lumbar extension ROM as well as hip  flexors.   OBJECTIVE IMPAIRMENTS decreased activity tolerance, decreased balance, decreased mobility, decreased ROM, decreased strength, and pain.    ACTIVITY LIMITATIONS standing, squatting, and stairs   PARTICIPATION LIMITATIONS: community activity, yard work, and recreational exercise   PERSONAL FACTORS Time since onset of injury/illness/exacerbation are also affecting patient's functional outcome.      GOALS: Goals reviewed with patient? No   SHORT TERM GOALS: Target date: 09/03/2022  Pt will be compliant and knowledgeable with initial HEP for improved comfort and carryover Baseline: initial HEP given  Goal status: MET   2.  Pt will self report left hip pain no greater than 5/10 for improved comfort and functional ability Baseline: 7/10 at worst Goal status: MET   LONG TERM GOALS: Target date: 10/08/2022    Pt will self report left hip pain no greater than 5/10 for improved comfort and functional ability Baseline: 7/10 at worst; 10/08/22 3-4/10 Goal status: Met   2.  Pt will increase 30 Second Sit to Stand rep count to no less than 10 reps for improved balance, strength, and functional mobility Baseline: 8 reps; slight increase in pain Goal status: ONGOING    3.  Pt will improve FOTO function score to no less than 63% as proxy for functional improvement Baseline: 55% function 09/10/2022: 52% function; 10/08/22 52% Goal status: ONGOING    4.  Pt will increase L hip MMT to no less than 5/5 for all tested motions for improved comfort and functional ability  Baseline: see chart Goal status: INITIAL   PLAN: PT FREQUENCY: 1-2x/week   PT DURATION: 8 weeks   PLANNED INTERVENTIONS: Therapeutic exercises, Therapeutic activity, Neuromuscular re-education, Balance training, Gait training, Patient/Family education, Self Care, Joint mobilization, Dry Needling, Electrical stimulation, Cryotherapy, Moist heat, Manual therapy, and Re-evaluation   PLAN FOR NEXT SESSION: assess HEP  response, progress proximal hip strength, f/u on symptom increase from previous session, re-assess for 10th session   Lanice Shirts, PT 10/08/2022, 10:39 AM

## 2022-10-17 ENCOUNTER — Ambulatory Visit: Payer: Medicare Other | Attending: Interventional Cardiology | Admitting: Interventional Cardiology

## 2022-10-17 ENCOUNTER — Encounter: Payer: Self-pay | Admitting: Interventional Cardiology

## 2022-10-17 VITALS — BP 116/74 | HR 80 | Ht 73.0 in | Wt 228.6 lb

## 2022-10-17 DIAGNOSIS — R0602 Shortness of breath: Secondary | ICD-10-CM

## 2022-10-17 DIAGNOSIS — R7303 Prediabetes: Secondary | ICD-10-CM | POA: Diagnosis not present

## 2022-10-17 DIAGNOSIS — E66811 Obesity, class 1: Secondary | ICD-10-CM

## 2022-10-17 DIAGNOSIS — E669 Obesity, unspecified: Secondary | ICD-10-CM | POA: Diagnosis present

## 2022-10-17 NOTE — Patient Instructions (Signed)
Medication Instructions:  Your physician recommends that you continue on your current medications as directed. Please refer to the Current Medication list given to you today.  *If you need a refill on your cardiac medications before your next appointment, please call your pharmacy*   Lab Work: none If you have labs (blood work) drawn today and your tests are completely normal, you will receive your results only by: MyChart Message (if you have MyChart) OR A paper copy in the mail If you have any lab test that is abnormal or we need to change your treatment, we will call you to review the results.   Testing/Procedures: Your physician has requested that you have an echocardiogram. Echocardiography is a painless test that uses sound waves to create images of your heart. It provides your doctor with information about the size and shape of your heart and how well your heart's chambers and valves are working. This procedure takes approximately one hour. There are no restrictions for this procedure. Please do NOT wear cologne, perfume, aftershave, or lotions (deodorant is allowed). Please arrive 15 minutes prior to your appointment time.    Follow-Up: At [facility], you and your health needs are our priority.  As part of our continuing mission to provide you with exceptional heart care, we have created designated Provider Care Teams.  These Care Teams include your primary Cardiologist (physician) and Advanced Practice Providers (APPs -  Physician Assistants and Nurse Practitioners) who all work together to provide you with the care you need, when you need it.  We recommend signing up for the patient portal called "MyChart".  Sign up information is provided on this After Visit Summary.  MyChart is used to connect with patients for Virtual Visits (Telemedicine).  Patients are able to view lab/test results, encounter notes, upcoming appointments, etc.  Non-urgent messages can be sent to your  provider as well.   To learn more about what you can do with MyChart, go to [email or web address].    Your next appointment:   12 month(s)  The format for your next appointment:   In Person  Provider:   Jayadeep Varanasi, MD     Other Instructions  High-Fiber Eating Plan Fiber, also called dietary fiber, is a type of carbohydrate. It is found foods such as fruits, vegetables, whole grains, and beans. A high-fiber diet can have many health benefits. Your health care provider may recommend a high-fiber diet to help: Prevent constipation. Fiber can make your bowel movements more regular. Lower your cholesterol. Relieve the following conditions: Inflammation of veins in the anus (hemorrhoids). Inflammation of specific areas of the digestive tract (uncomplicated diverticulosis). A problem of the large intestine, also called the colon, that sometimes causes pain and diarrhea (irritable bowel syndrome, or IBS). Prevent overeating as part of a weight-loss plan. Prevent heart disease, type 2 diabetes, and certain cancers. What are tips for following this plan? Reading food labels  Check the nutrition facts label on food products for the amount of dietary fiber. Choose foods that have 5 grams of fiber or more per serving. The goals for recommended daily fiber intake include: Men (age 50 or younger): 34-38 g. Men (over age 50): 28-34 g. Women (age 50 or younger): 25-28 g. Women (over age 50): 22-25 g. Your daily fiber goal is _____________ g. Shopping Choose whole fruits and vegetables instead of processed forms, such as apple juice or applesauce. Choose a wide variety of high-fiber foods such as avocados, lentils, oats, and kidney beans.   Read the nutrition facts label of the foods you choose. Be aware of foods with added fiber. These foods often have high sugar and sodium amounts per serving. Cooking Use whole-grain flour for baking and cooking. Cook with brown rice instead of white  rice. Meal planning Start the day with a breakfast that is high in fiber, such as a cereal that contains 5 g of fiber or more per serving. Eat breads and cereals that are made with whole-grain flour instead of refined flour or white flour. Eat brown rice, bulgur wheat, or millet instead of white rice. Use beans in place of meat in soups, salads, and pasta dishes. Be sure that half of the grains you eat each day are whole grains. General information You can get the recommended daily intake of dietary fiber by: Eating a variety of fruits, vegetables, grains, nuts, and beans. Taking a fiber supplement if you are not able to take in enough fiber in your diet. It is better to get fiber through food than from a supplement. Gradually increase how much fiber you consume. If you increase your intake of dietary fiber too quickly, you may have bloating, cramping, or gas. Drink plenty of water to help you digest fiber. Choose high-fiber snacks, such as berries, raw vegetables, nuts, and popcorn. What foods should I eat? Fruits Berries. Pears. Apples. Oranges. Avocado. Prunes and raisins. Dried figs. Vegetables Sweet potatoes. Spinach. Kale. Artichokes. Cabbage. Broccoli. Cauliflower. Green peas. Carrots. Squash. Grains Whole-grain breads. Multigrain cereal. Oats and oatmeal. Brown rice. Barley. Bulgur wheat. Millet. Quinoa. Bran muffins. Popcorn. Rye wafer crackers. Meats and other proteins Navy beans, kidney beans, and pinto beans. Soybeans. Split peas. Lentils. Nuts and seeds. Dairy Fiber-fortified yogurt. Beverages Fiber-fortified soy milk. Fiber-fortified orange juice. Other foods Fiber bars. The items listed above may not be a complete list of recommended foods and beverages. Contact a dietitian for more information. What foods should I avoid? Fruits Fruit juice. Cooked, strained fruit. Vegetables Fried potatoes. Canned vegetables. Well-cooked vegetables. Grains White bread. Pasta made  with refined flour. White rice. Meats and other proteins Fatty cuts of meat. Fried chicken or fried fish. Dairy Milk. Yogurt. Cream cheese. Sour cream. Fats and oils Butters. Beverages Soft drinks. Other foods Cakes and pastries. The items listed above may not be a complete list of foods and beverages to avoid. Talk with your dietitian about what choices are best for you. Summary Fiber is a type of carbohydrate. It is found in foods such as fruits, vegetables, whole grains, and beans. A high-fiber diet has many benefits. It can help to prevent constipation, lower blood cholesterol, aid weight loss, and reduce your risk of heart disease, diabetes, and certain cancers. Increase your intake of fiber gradually. Increasing fiber too quickly may cause cramping, bloating, and gas. Drink plenty of water while you increase the amount of fiber you consume. The best sources of fiber include whole fruits and vegetables, whole grains, nuts, seeds, and beans. This information is not intended to replace advice given to you by your health care provider. Make sure you discuss any questions you have with your health care provider. Document Revised: 03/17/2020 Document Reviewed: 03/17/2020 Elsevier Patient Education  2023 Elsevier Inc.   Important Information About Sugar       

## 2022-10-17 NOTE — Progress Notes (Signed)
Cardiology Office Note   Date:  10/17/2022   ID:  Ryan Phelps [birth year], MRN [id number]  PCP:  Shon Hale, MD    No chief complaint on file.  DOE  [employer] Readings from Last 3 Encounters:  10/17/22 228 lb 9.6 oz (103.7 kg)  08/28/22 225 lb (102.1 kg)  08/30/21 225 lb (102.1 kg)       History of Present Illness: Ryan Phelps is a 61 y.o. male who is being seen today for the evaluation of shortness of breath at the request of Shon Hale, *.   Over the summer, he felt that he was having to breath deep and a"hard exhale."  Went to ER in October with negative w/u.  Negative troponin, normal renal function, potassium.  Hemoglobin was high at 18.2.  He is prediabetic.      Past Medical History:  Diagnosis Date   Allergy    Bipolar 2 disorder (HCC)    Depression    GERD (gastroesophageal reflux disease)    Hyperlipidemia    Sleep apnea     Past Surgical History:  Procedure Laterality Date   TOOTH EXTRACTION Right 2022   WISDOM TOOTH EXTRACTION Bilateral 1985     Current Outpatient Medications  Medication Sig Dispense Refill   buPROPion (WELLBUTRIN SR) 150 MG 12 hr tablet Take 450 mg by mouth daily.     CAPLYTA 42 MG capsule Take 42 mg by mouth daily.     cetirizine (ZYRTEC) 10 MG chewable tablet Chew 10 mg by mouth daily.     Deutetrabenazine (AUSTEDO) 12 MG TABS Take 12 mg by mouth 2 (two) times daily.     Deutetrabenazine (AUSTEDO) 6 MG TABS Take 6 mg by mouth 2 (two) times daily. Takes with the 12mg  tablet     methylphenidate (RITALIN LA) 10 MG 24 hr capsule Take 1 capsule (10 mg total) by mouth 2 (two) times daily.     naproxen (NAPROSYN) 375 MG tablet Take 1 tablet twice daily as needed for pain 20 tablet 0   NON FORMULARY CPAP machine uses nightly     pravastatin (PRAVACHOL) 20 MG tablet Take 20 mg by mouth daily.     testosterone cypionate (DEPOTESTOTERONE CYPIONATE) 100 MG/ML injection Inject 0.5 mLs into the muscle once a  week.     No current facility-administered medications for this visit.    Allergies:   Penicillin g and Penicillins    Social History:  The patient  reports that he has never smoked. He has never used smokeless tobacco. He reports that he does not drink alcohol and does not use drugs.   Family History:  The patient's family history includes Cancer in his mother; Heart disease in his father; Sleep apnea in his brother.    ROS:  Please see the history of present illness.   Otherwise, review of systems are positive for sighing-improving.   All other systems are reviewed and negative.    PHYSICAL EXAM: VS:  BP 116/74   Pulse 80   Ht 6\' 1"  (1.854 m)   Wt 228 lb 9.6 oz (103.7 kg)   SpO2 95%   BMI 30.16 kg/m  , BMI Body mass index is 30.16 kg/m. GEN: Well nourished, well developed, in no acute distress HEENT: normal Neck: no JVD, carotid bruits, or masses Cardiac: RRR; no murmurs, rubs, or gallops,no edema  Respiratory:  clear to auscultation bilaterally, normal work of breathing GI: soft, nontender, nondistended, + BS, obese  MS: no deformity or atrophy Skin: warm and dry, no rash Neuro:  Strength and sensation are intact Psych: euthymic mood, full affect   EKG:   The ekg ordered today demonstrates normal sinus rhythm, left axis deviation, no ST changes   Recent Labs: 08/28/2022: B Natriuretic Peptide 4.6; BUN 19; Creatinine, Ser 1.12; Hemoglobin 18.2; Platelets 219; Potassium 3.8; Sodium 136   Lipid Panel No results found for: "CHOL", "TRIG", "HDL", "CHOLHDL", "VLDL", "LDLCALC", "LDLDIRECT"   Other studies Reviewed: Additional studies/ records that were reviewed today with results demonstrating: LDL 102 in 10/23.   ASSESSMENT AND PLAN:  Shortness of breath: Some DOE.  Sighing sx has decreased.  He is not very active. Psych issues have limited activity.  Hopefully the breathing improves with more walking and and improve lifestyle. Obesity:  Unhealthy diet in general.   Whole food plant-based diet.  High-fiber diet.  Avoid processed foods. Exercise target noted below.  Pre DM.  A1C 6.3.  Consider metformin to try to delay in onset of diabetes. Upcoming hematology workup given high hemoglobin.  We spoke about cardiac effects.  Because some concern for hemochromatosis given northern [place] descent and high hemoglobin.  Would need Iron levels checked and possible genetic testing.    Current medicines are reviewed at length with the patient today.  The patient concerns regarding his medicines were addressed.  The following changes have been made:  No change  Labs/ tests ordered today include: echo No orders of the defined types were placed in this encounter.   Recommend 150 minutes/week of aerobic exercise Low fat, low carb, high fiber diet recommended  Disposition:   FU in 1 year, or sooner if there is a problem with his echo   Lawson Radar, MD  10/17/2022 10:18 AM    [address], [place], [place]  [address] Phone: [phone]; Fax: [phone]

## 2022-10-22 ENCOUNTER — Inpatient Hospital Stay: Payer: Medicare Other | Attending: Nurse Practitioner

## 2022-10-22 ENCOUNTER — Encounter: Payer: Self-pay | Admitting: Nurse Practitioner

## 2022-10-22 ENCOUNTER — Other Ambulatory Visit: Payer: Self-pay | Admitting: *Deleted

## 2022-10-22 ENCOUNTER — Inpatient Hospital Stay (HOSPITAL_BASED_OUTPATIENT_CLINIC_OR_DEPARTMENT_OTHER): Payer: Medicare Other | Admitting: Nurse Practitioner

## 2022-10-22 ENCOUNTER — Telehealth: Payer: Self-pay

## 2022-10-22 ENCOUNTER — Other Ambulatory Visit: Payer: Self-pay

## 2022-10-22 VITALS — BP 140/72 | HR 76 | Temp 98.1°F | Resp 18 | Ht 73.0 in | Wt 228.0 lb

## 2022-10-22 DIAGNOSIS — D582 Other hemoglobinopathies: Secondary | ICD-10-CM | POA: Diagnosis not present

## 2022-10-22 DIAGNOSIS — D751 Secondary polycythemia: Secondary | ICD-10-CM

## 2022-10-22 LAB — CBC WITH DIFFERENTIAL (CANCER CENTER ONLY)
Abs Immature Granulocytes: 0.04 10*3/uL (ref 0.00–0.07)
Basophils Absolute: 0 10*3/uL (ref 0.0–0.1)
Basophils Relative: 0 %
Eosinophils Absolute: 0.1 10*3/uL (ref 0.0–0.5)
Eosinophils Relative: 1 %
HCT: 49.7 % (ref 39.0–52.0)
Hemoglobin: 17.2 g/dL — ABNORMAL HIGH (ref 13.0–17.0)
Immature Granulocytes: 1 %
Lymphocytes Relative: 27 %
Lymphs Abs: 1.4 10*3/uL (ref 0.7–4.0)
MCH: 28.6 pg (ref 26.0–34.0)
MCHC: 34.6 g/dL (ref 30.0–36.0)
MCV: 82.7 fL (ref 80.0–100.0)
Monocytes Absolute: 0.5 10*3/uL (ref 0.1–1.0)
Monocytes Relative: 9 %
Neutro Abs: 3.1 10*3/uL (ref 1.7–7.7)
Neutrophils Relative %: 62 %
Platelet Count: 178 10*3/uL (ref 150–400)
RBC: 6.01 MIL/uL — ABNORMAL HIGH (ref 4.22–5.81)
RDW: 13.2 % (ref 11.5–15.5)
WBC Count: 5 10*3/uL (ref 4.0–10.5)
nRBC: 0 % (ref 0.0–0.2)

## 2022-10-22 LAB — RETIC PANEL
Immature Retic Fract: 12.5 % (ref 2.3–15.9)
RBC.: 5.97 MIL/uL — ABNORMAL HIGH (ref 4.22–5.81)
Retic Count, Absolute: 120 10*3/uL (ref 19.0–186.0)
Retic Ct Pct: 2 % (ref 0.4–3.1)
Reticulocyte Hemoglobin: 32.9 pg (ref >27.9)

## 2022-10-22 LAB — SAVE SMEAR(SSMR), FOR PROVIDER SLIDE REVIEW

## 2022-10-22 NOTE — Telephone Encounter (Signed)
Called Lab and spoke with Maurine Minister B.  to add MPD panel, epo level and retic panel--orders are in and release

## 2022-10-22 NOTE — Addendum Note (Signed)
Addended by: Lacy Duverney R on: 10/22/2022 12:47 PM   Modules accepted: Orders

## 2022-10-22 NOTE — Progress Notes (Signed)
New Hematology/Oncology Consult   Requesting MD: Dr. Garth Bigness  [phone]  Reason for Consult: Polycythemia  HPI: Ryan Phelps is a 61 year old man referred for evaluation of polycythemia.  He was seen by Dr. Chanetta Marshall 08/27/2022 for evaluation of dyspnea.  Chest x-ray unremarkable.  He was referred to cardiology.  He presented to the emergency department 08/28/2022 with shortness of breath.  Chest CT was negative for PE or other acute finding.  CBC showed hemoglobin 18.2/hematocrit 52.4, white count and platelets in normal range.  Comparison CBCs-07/16/2022 hemoglobin 17.8/hematocrit 53; 12/04/2021 hemoglobin 16.9/hematocrit 48.9; 04/28/2021 hemoglobin 17.1/hematocrit 50.  Most remote CBC located in the EMR is from 06/01/2019-hemoglobin 15.6, hematocrit 46.5.     Past Medical History:  Diagnosis Date   Allergy    Bipolar 2 disorder (HCC)    Depression    GERD (gastroesophageal reflux disease)    Hyperlipidemia    Sleep apnea      Past Surgical History:  Procedure Laterality Date   TOOTH EXTRACTION Right 2022   WISDOM TOOTH EXTRACTION Bilateral 1985     Current Outpatient Medications:    ALPRAZolam (XANAX) 0.5 MG tablet, Take 0.5 mg by mouth at bedtime as needed for anxiety., Disp: , Rfl:    buPROPion (WELLBUTRIN SR) 150 MG 12 hr tablet, Take 150 mg by mouth daily., Disp: , Rfl:    CAPLYTA 42 MG capsule, Take 42 mg by mouth daily., Disp: , Rfl:    cetirizine (ZYRTEC) 10 MG chewable tablet, Chew 10 mg by mouth daily., Disp: , Rfl:    Deutetrabenazine (AUSTEDO) 12 MG TABS, Take 12 mg by mouth 2 (two) times daily., Disp: , Rfl:    Deutetrabenazine (AUSTEDO) 6 MG TABS, Take 6 mg by mouth 2 (two) times daily. Takes with the 12mg  tablet, Disp: , Rfl:    methylphenidate (RITALIN LA) 10 MG 24 hr capsule, Take 1 capsule (10 mg total) by mouth 2 (two) times daily., Disp: , Rfl:    NON FORMULARY, CPAP machine uses nightly, Disp: , Rfl:    pravastatin (PRAVACHOL) 20 MG tablet, Take 20  mg by mouth daily., Disp: , Rfl:    testosterone cypionate (DEPOTESTOTERONE CYPIONATE) 100 MG/ML injection, Inject 0.5 mLs into the muscle once a week., Disp: , Rfl:    zolpidem (AMBIEN) 10 MG tablet, Take 10 mg by mouth at bedtime as needed for sleep., Disp: , Rfl:    naproxen (NAPROSYN) 375 MG tablet, Take 1 tablet twice daily as needed for pain (Patient not taking: Reported on 10/22/2022), Disp: 20 tablet, Rfl: 0:    Allergies  Allergen Reactions   Penicillin G Other (See Comments)    unknown   Penicillins Other (See Comments)    Unknown childhood allergy    FH: Father with atrial fibrillation, "weak heart"; mother with possible chronic leukemia.  SOCIAL HISTORY: He lives in [place].  He is married.  He is a retired [employer].  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  No tobacco or alcohol use.  Review of Systems: He has never had a blood clot or stroke.  No pruritus after showering.  No nose or ear redness.  No pain, swelling or redness at the fingertips.  He has a good appetite.  No unexplained weight loss.  No fevers or sweats.  No rash.  No recent infections.  He denies bleeding.  No unusual headache.  Recent "floaters".  He is unable to drive.  He is followed by his eye doctor.  He denies shortness of  breath but notes that he "sighs" more over the past several months.  No chest pain.  No leg swelling or calf pain.  Recent tingling in the feet.  No urinary symptoms except increased frequency since beginning a new medication.  Physical Exam:  Blood pressure (!) 140/72, pulse 76, temperature 98.1 F (36.7 C), temperature source Oral, resp. rate 18, height 6\' 1"  (1.854 m), weight 228 lb (103.4 kg), SpO2 98 %.  Lungs: Lungs clear bilaterally. Cardiac: Regular rate and rhythm. Abdomen: No hepatosplenomegaly. Vascular: No leg edema. Lymph nodes: No palpable cervical, supraclavicular, axillary or inguinal lymph nodes. Neurologic: Alert and oriented. Skin: No  rash.  LABS:   Recent Labs    10/22/22 1349  WBC 5.0  HGB 17.2*  HCT 49.7  PLT 178  Peripheral blood smear-thick smear limits red cell evaluation; generally the red blood cells appear unremarkable, slight increase in polychromasia, few ovalocytes; white blood cell morphology unremarkable; platelets appear normal in number, few large platelets.  No results for input(s): "NA", "K", "CL", "CO2", "GLUCOSE", "BUN", "CREATININE", "CALCIUM" in the last 72 hours.    RADIOLOGY:  No results found.  Assessment and Plan:   Erythrocytosis Sleep apnea Testosterone replacement therapy Bipolar disorder Depression Hyperlipidemia GERD  Ryan Phelps was referred for evaluation of an elevated hemoglobin.  The hemoglobin may be elevated due to testosterone replacement therapy, sleep apnea.  We will check an erythropoietin level and myeloproliferative panel.  He will return for follow-up in 1 to 2 months.  Patient seen with Dr. Lou Cal.    Truett Perna, NP 10/22/2022, 3:21 PM  This was a shared visit with 10/24/2022.  Ryan Phelps was interviewed and examined.  I reviewed the peripheral blood smear. Ryan Phelps is referred for evaluation of erythrocytosis.  I suspect he has true erythrocytosis, potentially related to sleep apnea and testosterone therapy.  He may have a myeloproliferative disorder, but this is less likely.  The hemoglobin is mildly elevated and he does not have symptoms to suggest polycythemia.  We will consider discontinuing or lowering the testosterone dose if the myeloproliferative disorder panel is negative.  I was present for greater than 50% today's visit.  I performed medical decision making.  Lou Cal, MD

## 2022-10-22 NOTE — Progress Notes (Signed)
Labs orders entered for New patient appt

## 2022-10-24 ENCOUNTER — Other Ambulatory Visit: Payer: Self-pay

## 2022-10-24 ENCOUNTER — Ambulatory Visit: Payer: Medicare Other

## 2022-10-24 DIAGNOSIS — R262 Difficulty in walking, not elsewhere classified: Secondary | ICD-10-CM

## 2022-10-24 DIAGNOSIS — M6281 Muscle weakness (generalized): Secondary | ICD-10-CM

## 2022-10-24 DIAGNOSIS — M25552 Pain in left hip: Secondary | ICD-10-CM

## 2022-10-24 LAB — ERYTHROPOIETIN: Erythropoietin: 13.9 m[IU]/mL (ref 2.6–18.5)

## 2022-10-24 NOTE — Therapy (Signed)
OUTPATIENT PHYSICAL THERAPY TREATMENT NOTE   Patient Name: Ryan Phelps MRN: [id number] DOB:09-May-1961, 61 y.o., male Today's Date: 10/24/2022  PCP: Glenis Smoker, MD REFERRING PROVIDER: Nada Boozer, MD PHYSICAL THERAPY DISCHARGE SUMMARY  Visits from Start of Care: 11  Current functional level related to goals / functional outcomes: Goals met   Remaining deficits: soreness   Education / Equipment: HEP   Patient agrees to discharge. Patient goals were met. Patient is being discharged due to being pleased with the current functional level.  END OF SESSION:   PT End of Session - 10/24/22 1309     Visit Number 12    Number of Visits 17    Authorization Type Medicare    PT Start Time 1000    PT Stop Time 1040    PT Time Calculation (min) 40 min    Activity Tolerance Patient tolerated treatment well    Behavior During Therapy WFL for tasks assessed/performed               Past Medical History:  Diagnosis Date   Allergy    Bipolar 2 disorder ([place])    Depression    GERD (gastroesophageal reflux disease)    Hyperlipidemia    Sleep apnea    Past Surgical History:  Procedure Laterality Date   TOOTH EXTRACTION Right 2022   WISDOM TOOTH EXTRACTION Bilateral 1985   There are no problems to display for this patient.   REFERRING DIAG: M25.552 (ICD-10-CM) - Pain in left hip  THERAPY DIAG: Pain in left hip - Plan: PT plan of care cert/re-cert   Muscle weakness (generalized) - Plan: PT plan of care cert/re-cert  Rationale for Evaluation and Treatment Rehabilitation  PERTINENT HISTORY: None  PRECAUTIONS: None  SUBJECTIVE: Only soreness noted, 2/10, symptoms slowly resolving with HEP and time.  Feels he can manage condition independently  PAIN:  Are you having pain? no Yes: NPRS scale: 2/10 Pain location: L hip Pain description: ache Aggravating factors: sitting, standing Relieving factors: position changes   OBJECTIVE: (objective  measures completed at initial evaluation unless otherwise dated)   DIAGNOSTIC FINDINGS:            N/A   PATIENT SURVEYS:  FOTO: 52% function - 09/10/2022   COGNITION:           Overall cognitive status: Within functional limits for tasks assessed                          SENSATION: WFL   POSTURE: rounded shoulders, forward head, and medium body habitus   PALPATION: TTP to L piriformis, L glute med   LOWER EXTREMITY ROM:   Active ROM Right eval Left eval  Hip flexion      Hip extension      Hip abduction      Hip adduction      Hip internal rotation 35 25  Hip external rotation 30 20  Knee flexion      Knee extension      Ankle dorsiflexion      Ankle plantarflexion      Ankle inversion      Ankle eversion       (Blank rows = not tested)   LOWER EXTREMITY MMT:   MMT Right eval Left eval R 10/23/22 L 10/23/22  Hip flexion 5/5 4/5  5  Hip extension        Hip abduction 5/5 4/5  5  Hip adduction  5/5 4/5  5  Hip internal rotation 5/5 4/5  5  Hip external rotation 5/5 4/5  5  Knee flexion        Knee extension        Ankle dorsiflexion        Ankle plantarflexion        Ankle inversion        Ankle eversion         (Blank rows = not tested)   LOWER EXTREMITY SPECIAL TESTS:  Hip special tests: Saralyn Pilar (FABER) test: negative and Hip scouring test: negative   FUNCTIONAL TESTS:  30 Second Sit to Stand: 8 reps; slight increase in pain   GAIT: Distance walked: 76f Assistive device utilized: None Level of assistance: Complete Independence Comments: slight antalgic gait on L      TODAY'S TREATMENT: [facility] Adult PT Treatment:                                                DATE: 10/23/22 Therapeutic Exercise: Nustep L6 x 8 min Open book 10/10 Single leg bridge 15/15 Supine 90/90 B sidelie abducton in extension 15x Prone press 10x HEP review and update  [facility] Adult PT Treatment:                                                DATE:  10/08/22 Therapeutic Exercise: Nustep L6 x 8 min Open book 10/10 Supine SKTC 4# 15/15 Supine SLR 2x15 4#  Bridge w/ball 2000g 15x Bridge against BluTB 15x Side lie clams BluTB L 15x Supine hip fallouts BluTB 15x B sidelie abducton in extension 15x Prone prop 2 min  [facility] Adult PT Treatment:                                                DATE: 10/01/22 Therapeutic Exercise: Nustep L6 x 8 min Open book 10/10 STS 2x10 10# KB from Airex pad Modified Lucciano stretch L 30s x2 Supine SLR 2x15 4#  Bridge w/ball 15x Bridge against BluTB 15x Single leg bridge fig 4 15x B Supine piriformis stretch 2x30s L push technique Side lie clams BluTB L 15x2 Supine hip fallouts BluTB 15x R sidelie abducton in extension 15x2 Prone press 10x       PATIENT EDUCATION:  Education details: HEP update Person educated: Patient Education method: Explanation, Demonstration, and Handouts Education comprehension: verbalized understanding and returned demonstration     HOME EXERCISE PROGRAM: Access Code: [email or web address] URL: [email or web address] Date: 10/24/2022 Prepared by: JSharlynn Oliphant Exercises - Prone Press Up  - 1 x daily - 3 x weekly - 3 sets - 10 reps - Sidelying Open Book Thoracic Lumbar Rotation and Extension  - 1 x daily - 3 x weekly - 1 sets - 10 reps - Figure 4 Bridge  - 1 x daily - 3 x weekly - 2 sets - 15 reps - Supine 90/90 Shoulder Flexion with Abdominal Bracing  - 1 x daily - 3 x weekly - 1 sets - 2 reps - 30s hold - Supine Figure 4 Piriformis Stretch  - 1 x daily -  3 x weekly - 1 sets - 2 reps - 30s hold   ASSESSMENT:   CLINICAL IMPRESSION: All rehab goals met, pain resolved and patient ready to manage condition independently    OBJECTIVE IMPAIRMENTS decreased activity tolerance, decreased balance, decreased mobility, decreased ROM, decreased strength, and pain.    ACTIVITY LIMITATIONS standing, squatting, and stairs   PARTICIPATION LIMITATIONS: community  activity, yard work, and recreational exercise   PERSONAL FACTORS Time since onset of injury/illness/exacerbation are also affecting patient's functional outcome.      GOALS: Goals reviewed with patient? No   SHORT TERM GOALS: Target date: 09/03/2022  Pt will be compliant and knowledgeable with initial HEP for improved comfort and carryover Baseline: initial HEP given  Goal status: MET   2.  Pt will self report left hip pain no greater than 5/10 for improved comfort and functional ability Baseline: 7/10 at worst Goal status: MET   LONG TERM GOALS: Target date: 10/08/2022    Pt will self report left hip pain no greater than 5/10 for improved comfort and functional ability Baseline: 7/10 at worst; 10/08/22 3-4/10 Goal status: Met   2.  Pt will increase 30 Second Sit to Stand rep count to no less than 10 reps for improved balance, strength, and functional mobility Baseline: 8 reps; slight increase in pain; 10/24/22 10s arms crossed no discomfort. Goal status: Met   3.  Pt will improve FOTO function score to no less than 63% as proxy for functional improvement Baseline: 55% function 09/10/2022: 52% function; 10/08/22 52%; 10/23/22 60% Goal status: Met   4.  Pt will increase L hip MMT to no less than 5/5 for all tested motions for improved comfort and functional ability  Baseline: see chart Goal status: Met   PLAN: PT FREQUENCY: 1-2x/week   PT DURATION: 1 week   PLANNED INTERVENTIONS: Therapeutic exercises, Therapeutic activity, Neuromuscular re-education, Balance training, Gait training, Patient/Family education, Self Care, Joint mobilization, Dry Needling, Electrical stimulation, Cryotherapy, Moist heat, Manual therapy, and Re-evaluation   PLAN FOR NEXT SESSION: DC to HEP  Lanice Shirts, PT 10/24/2022, 1:16 PM

## 2022-10-25 ENCOUNTER — Other Ambulatory Visit: Payer: Self-pay | Admitting: Nurse Practitioner

## 2022-10-25 ENCOUNTER — Inpatient Hospital Stay: Payer: Medicare Other

## 2022-10-25 DIAGNOSIS — D582 Other hemoglobinopathies: Secondary | ICD-10-CM

## 2022-11-02 ENCOUNTER — Telehealth: Payer: Self-pay | Admitting: *Deleted

## 2022-11-02 NOTE — Telephone Encounter (Signed)
Notified patient that the lab did not pick up his specimen last draw, so the JAK2 testing could not be done. He agrees to come again 12/13 for re-collection. Lab supervisor said he will not be charged. Will also reach out to director of oncology to ensure he does not incur another facility fee.

## 2022-11-07 ENCOUNTER — Other Ambulatory Visit: Payer: Self-pay | Admitting: Nurse Practitioner

## 2022-11-07 ENCOUNTER — Inpatient Hospital Stay: Payer: Medicare Other | Attending: Nurse Practitioner

## 2022-11-07 DIAGNOSIS — D582 Other hemoglobinopathies: Secondary | ICD-10-CM

## 2022-11-12 ENCOUNTER — Ambulatory Visit (HOSPITAL_COMMUNITY): Payer: Medicare Other | Attending: Interventional Cardiology

## 2022-11-12 DIAGNOSIS — R0602 Shortness of breath: Secondary | ICD-10-CM | POA: Insufficient documentation

## 2022-11-12 LAB — ECHOCARDIOGRAM COMPLETE: Area-P 1/2: 2.56 cm2

## 2022-11-13 ENCOUNTER — Telehealth: Payer: Self-pay | Admitting: *Deleted

## 2022-11-13 NOTE — Telephone Encounter (Signed)
-----   Message from Corky Crafts, MD sent at 11/12/2022  4:30 PM EST ----- Normal LV/RV/valvular function.  Can consider metformin ER 750 mg daily for prediabetes.  OK to call in 90 day supply, Refills 3. He can also discuss with his PMD.  F/u in 1 year.  He should also have w/u for high hemoglobin as we talked about at our visit.

## 2022-11-13 NOTE — Telephone Encounter (Signed)
Left message to call office

## 2022-11-22 NOTE — Telephone Encounter (Signed)
Patient notified.  He is seeing hematology.  He does not want metformin called in to pharmacy at this time.  He will discuss with his primary care doctor.

## 2022-11-26 HISTORY — PX: LUMBAR MICRODISCECTOMY: SHX99

## 2022-11-28 ENCOUNTER — Encounter: Payer: Self-pay | Admitting: Nurse Practitioner

## 2022-12-04 LAB — JAK2 (INCLUDING V617F AND EXON 12), MPL,& CALR-NEXT GEN SEQ

## 2022-12-19 ENCOUNTER — Inpatient Hospital Stay: Payer: Medicare Other | Attending: Nurse Practitioner

## 2022-12-19 ENCOUNTER — Inpatient Hospital Stay (HOSPITAL_BASED_OUTPATIENT_CLINIC_OR_DEPARTMENT_OTHER): Payer: Medicare Other | Admitting: Nurse Practitioner

## 2022-12-19 ENCOUNTER — Encounter: Payer: Self-pay | Admitting: Nurse Practitioner

## 2022-12-19 VITALS — BP 132/71 | HR 66 | Temp 98.1°F | Resp 18 | Ht 73.0 in | Wt 216.0 lb

## 2022-12-19 DIAGNOSIS — D582 Other hemoglobinopathies: Secondary | ICD-10-CM

## 2022-12-19 DIAGNOSIS — G4730 Sleep apnea, unspecified: Secondary | ICD-10-CM | POA: Diagnosis not present

## 2022-12-19 DIAGNOSIS — E785 Hyperlipidemia, unspecified: Secondary | ICD-10-CM | POA: Insufficient documentation

## 2022-12-19 DIAGNOSIS — D751 Secondary polycythemia: Secondary | ICD-10-CM | POA: Diagnosis not present

## 2022-12-19 DIAGNOSIS — F319 Bipolar disorder, unspecified: Secondary | ICD-10-CM | POA: Insufficient documentation

## 2022-12-19 DIAGNOSIS — K219 Gastro-esophageal reflux disease without esophagitis: Secondary | ICD-10-CM | POA: Insufficient documentation

## 2022-12-19 LAB — CBC WITH DIFFERENTIAL (CANCER CENTER ONLY)
Abs Immature Granulocytes: 0 10*3/uL (ref 0.00–0.07)
Basophils Absolute: 0 10*3/uL (ref 0.0–0.1)
Basophils Relative: 1 %
Eosinophils Absolute: 0.1 10*3/uL (ref 0.0–0.5)
Eosinophils Relative: 2 %
HCT: 51.1 % (ref 39.0–52.0)
Hemoglobin: 17.6 g/dL — ABNORMAL HIGH (ref 13.0–17.0)
Immature Granulocytes: 0 %
Lymphocytes Relative: 28 %
Lymphs Abs: 1.1 10*3/uL (ref 0.7–4.0)
MCH: 28.7 pg (ref 26.0–34.0)
MCHC: 34.4 g/dL (ref 30.0–36.0)
MCV: 83.4 fL (ref 80.0–100.0)
Monocytes Absolute: 0.4 10*3/uL (ref 0.1–1.0)
Monocytes Relative: 10 %
Neutro Abs: 2.3 10*3/uL (ref 1.7–7.7)
Neutrophils Relative %: 59 %
Platelet Count: 165 10*3/uL (ref 150–400)
RBC: 6.13 MIL/uL — ABNORMAL HIGH (ref 4.22–5.81)
RDW: 12.9 % (ref 11.5–15.5)
WBC Count: 3.8 10*3/uL — ABNORMAL LOW (ref 4.0–10.5)
nRBC: 0 % (ref 0.0–0.2)

## 2022-12-19 NOTE — Progress Notes (Signed)
  [facility] OFFICE PROGRESS NOTE   Diagnosis: Polycythemia  INTERVAL HISTORY:   [name] returns as scheduled. He reports being more depressed. He saw his psychiatrist earlier this week and medications were adjusted.  He joined a gym. No symptom of thrombosis.   Objective:  Vital signs in last 24 hours:  Blood pressure 132/71, pulse 66, temperature 98.1 F (36.7 C), temperature source Oral, resp. rate 18, height 6\' 1"  (1.854 m), weight 216 lb (98 kg), SpO2 100 %.    Resp: Lungs clear bilaterally. Cardio: Regular rate and rhythm. GI: Abdomen soft and nontender, No hepatosplenomegaly. Vascular: No leg edema. Calves soft and nontender.    Lab Results:  Lab Results  Component Value Date   WBC 3.8 (L) 12/19/2022   HGB 17.6 (H) 12/19/2022   HCT 51.1 12/19/2022   MCV 83.4 12/19/2022   PLT 165 12/19/2022   NEUTROABS 2.3 12/19/2022    Imaging:  No results found.  Medications: I have reviewed the patient's current medications.  Assessment/Plan: Erythrocytosis 10/22/2022 erythropoietin level normal 11/07/2022 JAK2 mutation testing negative Sleep apnea Testosterone replacement therapy Bipolar disorder Depression Hyperlipidemia GERD  Disposition: Mr. Dube remains stable from a hematologic standpoint. We reviewed the negative JAK2 mutation testing and normal erythropoietin level. We discussed the possibility the erythrocytosis is related to testosterone therapy and/or sleep apnea. We discussed stopping testosterone and repeating a CBC in a few months.  He does not wish to stop testosterone at this time.   He will return for a CBC and  follow-up visit in 3 months.     Dontarius Sheley ANP/GNP-BC   12/19/2022  9:52 AM

## 2023-01-28 NOTE — Progress Notes (Unsigned)
Assessment/Plan:    1.  Tremor             -Patient is on multiple tremor inducing medications.  His tremor, however, is likely due to Depakote, although Ritalin certainly may be contributing.  He and I discussed that adding additional medication is often not helpful for drug-induced tremor.  We discussed that we could try, however.  Ultimately, after quite a discussion, the patient decided to instead try weighted gloves and weighted fork/splints to see if that would help first.  If it does not, then we can certainly try something like primidone, although success rate can be low due to the presence of tremor inducing medications.             -The good news is is that I did not see any parkinsonism or tremor that appeared due to Abilify.  He and I discussed this in detail.   2.  Probable tardive dyskinesia             -He described what sounded like tardive dyskinesia in the hands.  He stated that that resolved once he was placed on Austedo.  He has been happy with the efficacy of Austedo and I certainly would recommend continuing that.  He and I talked about the fact that both Abilify and Austedo can produce parkinsonism, but again, I did not see any evidence of parkinsonism in him today.  I would recommend he continue the Austedo.  Subjective:   Ryan Phelps was seen today in follow up for tremor.  My previous records were reviewed prior to todays visit. Pt has not been seen in over 2 years.  When I last saw him, the patient was on multiple tremor inducing medications, although I felt that the tremor is likely due to Depakote.  I told him Ritalin could contribute as well.  At that point in time, he opted to hold off on trying any further medication and was going to try some weighted gloves and weighted spoon/fork's to see if it would help.  We discussed that medication success rate in helping tremor in the presence of tremor inducing medication was fairly low.  Since our last visit, his Abilify  was discontinued.  He is now on Caplyta.  He remains on Ritalin.  He is also still on Austedo.  Current prescribed movement disorder medications: ***   PREVIOUS MEDICATIONS: {Parkinson's RX:18200} Ingrezza, 80 mg; Abilify; Austedo; Depakote  ALLERGIES:   Allergies  Allergen Reactions   Penicillin G Other (See Comments)    unknown   Penicillins Other (See Comments)    Unknown childhood allergy    CURRENT MEDICATIONS:  No outpatient medications have been marked as taking for the 01/30/23 encounter (Appointment) with Agustus Mane, Eustace Quail, DO.      Objective:    PHYSICAL EXAMINATION:    VITALS:  There were no vitals filed for this visit.  GEN:  The patient appears stated age and is in NAD. HEENT:  Normocephalic, atraumatic.  The mucous membranes are moist. The superficial temporal arteries are without ropiness or tenderness. CV:  RRR Lungs:  CTAB Neck/HEME:  There are no carotid bruits bilaterally.  Neurological examination:  Orientation: The patient is alert and oriented x3.  Cranial nerves: There is good facial symmetry. Extraocular muscles are intact. The visual fields are full to confrontational testing. The speech is fluent and clear. Soft palate rises symmetrically and there is no tongue deviation. Hearing is intact to conversational tone. Sensation: Sensation  is intact to light and pinprick throughout (facial, trunk, extremities). Vibration is intact at the bilateral big toe. There is no extinction with double simultaneous stimulation. There is no sensory dermatomal level identified. Motor: Strength is 5/5 in the bilateral upper and lower extremities.   Shoulder shrug is equal and symmetric.  There is no pronator drift. Deep tendon reflexes: Deep tendon reflexes are 0-1/4 at the bilateral biceps, triceps, brachioradialis, patella and achilles. Plantar responses are downgoing bilaterally.   Movement examination: Tone: There is nl tone in the bilateral upper extremities.  The  tone in the lower extremities is nl.  Abnormal movements: no rest tremor.  He has min postural tremor, left greater than right.  Mild tremor with Archimedes spirals, mostly on the left.  He had evidence of tremor when pouring water from one glass to another, especially on the left. Coordination:  There is no decremation with RAM's, with any form of RAMS, including alternating supination and pronation of the forearm, hand opening and closing, finger taps, heel taps and toe taps. Gait and Station: The patient has no difficulty arising out of a deep-seated chair without the use of the hands. The patient's stride length is good.  Patient is able to ambulate in a tandem fashion. I have reviewed and interpreted the following labs independently   Chemistry      Component Value Date/Time   NA 136 08/28/2022 1544   K 3.8 08/28/2022 1544   CL 105 08/28/2022 1544   CO2 19 (L) 08/28/2022 1544   BUN 19 08/28/2022 1544   CREATININE 1.12 08/28/2022 1544      Component Value Date/Time   CALCIUM 9.7 08/28/2022 1544      Lab Results  Component Value Date   WBC 3.8 (L) 12/19/2022   HGB 17.6 (H) 12/19/2022   HCT 51.1 12/19/2022   MCV 83.4 12/19/2022   PLT 165 12/19/2022   No results found for: "TSH"   Chemistry      Component Value Date/Time   NA 136 08/28/2022 1544   K 3.8 08/28/2022 1544   CL 105 08/28/2022 1544   CO2 19 (L) 08/28/2022 1544   BUN 19 08/28/2022 1544   CREATININE 1.12 08/28/2022 1544      Component Value Date/Time   CALCIUM 9.7 08/28/2022 1544         Total time spent on today's visit was ***30 minutes, including both face-to-face time and nonface-to-face time.  Time included that spent on review of records (prior notes available to me/labs/imaging if pertinent), discussing treatment and goals, answering patient's questions and coordinating care.  Cc:  Glenis Smoker, MD

## 2023-01-30 ENCOUNTER — Encounter: Payer: Self-pay | Admitting: Neurology

## 2023-01-30 ENCOUNTER — Other Ambulatory Visit: Payer: Self-pay

## 2023-01-30 ENCOUNTER — Telehealth: Payer: Self-pay

## 2023-01-30 ENCOUNTER — Ambulatory Visit (INDEPENDENT_AMBULATORY_CARE_PROVIDER_SITE_OTHER): Payer: Medicare Other | Admitting: Neurology

## 2023-01-30 VITALS — BP 132/62 | HR 74 | Ht 72.0 in | Wt 218.6 lb

## 2023-01-30 DIAGNOSIS — T43505A Adverse effect of unspecified antipsychotics and neuroleptics, initial encounter: Secondary | ICD-10-CM | POA: Diagnosis not present

## 2023-01-30 DIAGNOSIS — G2401 Drug induced subacute dyskinesia: Secondary | ICD-10-CM

## 2023-01-30 DIAGNOSIS — G2111 Neuroleptic induced parkinsonism: Secondary | ICD-10-CM | POA: Diagnosis not present

## 2023-01-30 DIAGNOSIS — G251 Drug-induced tremor: Secondary | ICD-10-CM

## 2023-01-30 MED ORDER — ESCITALOPRAM OXALATE 10 MG PO TABS: 20.0000 mg | ORAL_TABLET | Freq: Every day | ORAL | 0 refills | Status: DC

## 2023-01-30 MED ORDER — ALPRAZOLAM 0.5 MG PO TABS: 0.5000 mg | ORAL_TABLET | Freq: Three times a day (TID) | ORAL | 0 refills | Status: DC

## 2023-01-30 NOTE — Telephone Encounter (Signed)
Patient called in wanting to speak with someone about correcting the dosage on his medication. Also wondering with the incorrect dosages on these medications, does this change anything that he discussed with Dr.Tat.

## 2023-01-30 NOTE — Telephone Encounter (Signed)
Called pateint back and adjusted medication

## 2023-01-30 NOTE — Patient Instructions (Addendum)
We have discussed that your parkinsonian symptoms are likely due to your caplyta medication. Austedo CAN cause parkinsonism as well. You need to contact your prescribing physician or provider to discuss what we discussed today.  Do not just stop the medication on your own.  If you are able to get off of the medication,  it can take up to 6 months to resolve some of the symptoms that you are experiencing.  If you are able to get off of the medication AND your symptoms are not resolved in 6 months, then I want to see you back in the office for a follow up appointment.  The physicians and staff at [facility] are committed to providing excellent care. You may receive a survey requesting feedback about your experience at our office. We strive to receive "very good" responses to the survey questions. If you feel that your experience would prevent you from giving the office a "very good " response, please contact our office to try to remedy the situation. We may be reached at [phone]. Thank you for taking the time out of your busy day to complete the survey.

## 2023-02-12 ENCOUNTER — Encounter: Payer: Self-pay | Admitting: Neurology

## 2023-03-07 ENCOUNTER — Encounter: Payer: Self-pay | Admitting: Neurology

## 2023-03-20 ENCOUNTER — Inpatient Hospital Stay: Payer: Medicare Other | Admitting: Oncology

## 2023-03-20 ENCOUNTER — Inpatient Hospital Stay: Payer: Medicare Other

## 2023-07-24 NOTE — Progress Notes (Signed)
Assessment/Plan:   1.  Tardive dyskinesia  -on austedo.  Tried to stop it, but tardive dyskinesia symptoms, particularly oral buccal lingual dyskinesia, got much worse.  -Austedo may be contributing to mild parkinsonism 2.  Parkinsonian tremor  -Persisting off of Caplyta  -Do DaTscan.  Hold escitalopram for DaT scan  -Do skin biopsy for alpha-synuclein  -Suspect that the above will be negative.  If so, he will not need anything further from a neurologic standpoint.  If not, he will need to continue to follow here. Subjective:   Ryan Phelps was seen today in follow up.  Patient with wife who supplements history.  I last saw the patient in March, 2024.  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  I told him to discuss that with Dr. Evelene Croon.  We also discussed that his Austedo could cause parkinsonism as well.  He reports today that he is still on that.  His Caplyta has been discontinued.  He continues to follow with Dr. Evelene Croon. He is on carbamazepine now.  He reports today that he feels "depressed but that has been going on for 35 years."  He reports he has the tremor still in the R arm but the shuffling and drooling is much better.  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  Wife notes it when trying to eat.      ALLERGIES:   Allergies  Allergen Reactions   Penicillin G Other (See Comments)    unknown   Penicillins Other (See Comments)    Unknown childhood allergy    CURRENT MEDICATIONS:  Current Meds  Medication Sig   ALPRAZolam (XANAX) 0.5 MG tablet Take 1 tablet (0.5 mg total) by mouth 3 (three) times daily.   cetirizine (ZYRTEC) 10 MG chewable tablet Chew 10 mg by mouth daily.   Deutetrabenazine (AUSTEDO) 12 MG TABS Take 12 mg by mouth 2 (two) times daily.   Deutetrabenazine (AUSTEDO) 6 MG TABS Take 6 mg by mouth 2 (two) times daily. Takes with the 12mg  tablet    escitalopram (LEXAPRO) 10 MG tablet Take 2 tablets (20 mg total) by mouth daily.   lamoTRIgine (LAMICTAL) 100 MG tablet Take 400 mg by mouth daily.   NON FORMULARY CPAP machine uses nightly   pravastatin (PRAVACHOL) 20 MG tablet Take 20 mg by mouth daily.   testosterone cypionate (DEPOTESTOTERONE CYPIONATE) 100 MG/ML injection Inject 0.5 mLs into the muscle once a week.   zolpidem (AMBIEN) 10 MG tablet Take 10 mg by mouth at bedtime as needed for sleep.     Objective:   PHYSICAL EXAMINATION:    VITALS:   Vitals:   07/26/23 1430  BP: 118/70  Pulse: 73  SpO2: 92%  Weight: 223 lb 9.6 oz (101.4 kg)  Height: 6' (1.829 m)    GEN:  The patient appears stated age and is in NAD. HEENT:  Normocephalic, atraumatic.  The mucous membranes are moist. The superficial temporal arteries are without ropiness or tenderness. CV:  RRR Lungs:  CTAB Neck/HEME:  There are no carotid bruits bilaterally.  Neurological examination:  Orientation: The patient is alert and oriented x3. Cranial nerves: There is good facial symmetry with no significant facial hypomimia. The speech is fluent and clear. Soft palate rises symmetrically and there is no tongue deviation. Hearing is intact to conversational tone. Sensation: Sensation is intact to light  touch throughout Motor: Strength is at least antigravity x4.  Movement examination: Tone: There is nl tone in the UE/LE Abnormal movements: there is LUE rest tremor with distraction.  There is R>LUE rest tremor with ambulation Coordination:  There is mild decremation with finger taps on the L only.  All other RAMs are normal  Gait and Station: The patient has no difficulty arising out of a deep-seated chair without the use of the hands.  Patient ambulates well with good armswing but there is bilateral upper extremity rest tremor, right much more significant than left. I have reviewed and interpreted the following labs independently    Chemistry       Component Value Date/Time   NA 136 08/28/2022 1544   K 3.8 08/28/2022 1544   CL 105 08/28/2022 1544   CO2 19 (L) 08/28/2022 1544   BUN 19 08/28/2022 1544   CREATININE 1.12 08/28/2022 1544      Component Value Date/Time   CALCIUM 9.7 08/28/2022 1544       Lab Results  Component Value Date   WBC 3.8 (L) 12/19/2022   HGB 17.6 (H) 12/19/2022   HCT 51.1 12/19/2022   MCV 83.4 12/19/2022   PLT 165 12/19/2022    No results found for: "TSH"   Total time spent on today's visit was 30 minutes, including both face-to-face time and nonface-to-face time.  Time included that spent on review of records (prior notes available to me/labs/imaging if pertinent), discussing treatment and goals, answering patient's questions and coordinating care.  Cc:  Shon Hale, MD

## 2023-07-26 ENCOUNTER — Encounter: Payer: Self-pay | Admitting: Neurology

## 2023-07-26 ENCOUNTER — Ambulatory Visit (INDEPENDENT_AMBULATORY_CARE_PROVIDER_SITE_OTHER): Payer: Medicare Other | Admitting: Neurology

## 2023-07-26 VITALS — BP 118/70 | HR 73 | Ht 72.0 in | Wt 223.6 lb

## 2023-07-26 DIAGNOSIS — G2401 Drug induced subacute dyskinesia: Secondary | ICD-10-CM | POA: Diagnosis not present

## 2023-07-26 DIAGNOSIS — R251 Tremor, unspecified: Secondary | ICD-10-CM

## 2023-07-26 NOTE — Patient Instructions (Signed)

## 2023-08-12 ENCOUNTER — Ambulatory Visit: Payer: Medicare Other | Admitting: Neurology

## 2023-08-16 ENCOUNTER — Encounter (HOSPITAL_COMMUNITY)
Admission: RE | Admit: 2023-08-16 | Discharge: 2023-08-16 | Disposition: A | Discharge status: Home / Self Care | Payer: Medicare Other | Source: Ambulatory Visit | Attending: Neurology | Admitting: Neurology

## 2023-08-16 DIAGNOSIS — R251 Tremor, unspecified: Secondary | ICD-10-CM | POA: Diagnosis present

## 2023-08-16 MED ORDER — POTASSIUM IODIDE (ANTIDOTE) 130 MG PO TABS
ORAL_TABLET | ORAL | Status: AC
  Filled 2023-08-16: qty 1

## 2023-08-16 MED ORDER — POTASSIUM IODIDE (ANTIDOTE) 130 MG PO TABS: 130.0000 mg | ORAL_TABLET | Freq: Once | ORAL | Status: DC

## 2023-08-16 MED ORDER — IOFLUPANE I 123 185 MBQ/2.5ML IV SOLN
4.8000 | Freq: Once | INTRAVENOUS | Status: AC | PRN
  Administered 2023-08-16: 4.8 via INTRAVENOUS

## 2023-08-19 ENCOUNTER — Telehealth: Payer: Self-pay

## 2023-08-19 NOTE — Telephone Encounter (Signed)
Called patient and he understood the results from the Dat Scan.  Patient had two questions  1 does he still need to do the skin Biopsy scheduled on 10/18 2 patient would like to meet with Dr., Tat one more time to go over the meds that cause tremors and what Psyc drugs he needs to stay off of

## 2023-08-20 ENCOUNTER — Telehealth: Payer: Medicare Other | Admitting: Neurology

## 2023-08-20 DIAGNOSIS — G2111 Neuroleptic induced parkinsonism: Secondary | ICD-10-CM

## 2023-08-20 DIAGNOSIS — G2401 Drug induced subacute dyskinesia: Secondary | ICD-10-CM | POA: Diagnosis not present

## 2023-08-20 DIAGNOSIS — T43505A Adverse effect of unspecified antipsychotics and neuroleptics, initial encounter: Secondary | ICD-10-CM | POA: Diagnosis not present

## 2023-08-20 NOTE — Progress Notes (Signed)
Virtual Visit Via Video       Consent was obtained for video visit:  Yes.   Answered questions that patient had about telehealth interaction:  Yes.   I discussed the limitations, risks, security and privacy concerns of performing an evaluation and management service by telemedicine. I also discussed with the patient that there may be a patient responsible charge related to this service. The patient expressed understanding and agreed to proceed.  Pt location: Home Physician Location: office Name of referring provider:  Shon Hale, * I connected with Ryan Phelps at patients initiation/request on 08/20/2023 at  9:45 AM EDT by video enabled telemedicine application and verified that I am speaking with the correct person using two identifiers. Pt MRN:  [id number] Pt DOB:  07-24-1961 Video Participants:  Ryan Phelps;    Assessment/Plan:   1.  Tardive dyskinesia  -on austedo.   2.  Parkinsonian tremor  -Persisting off of Caplyta  -Discussed that Austedo may be contributing some.  He is going to discuss next steps with Dr. Evelene Croon  -DaTscan done in August 16, 2023 was normal.  -Had a skin biopsy scheduled, but ultimately we decided to hold on that.  -Discussed with the patient that the parkinsonian tremor may be tardive (tardive parkinsonism is rare, but not undocumented).  In addition, it can be coming from parkinsonism from [place].  Regardless, the data would suggest that he does not have Parkinsons disease.  He needs to follow back up with his psychiatrist  -He asks potentially about going back on Abilify for mood.  I told him that that would be a discussion between him and Dr. Evelene Croon, but certainly that is a strong walker of the D2 receptor and can cause more parkinsonian symptoms. 3.  Follow-up as needed Subjective:   Ryan Phelps was seen today in follow up.  Patient with wife who supplements history.  I last saw the patient in March, 2024.  When I have seen the  patient in the past, I felt that he likely had parkinsonism from his medications.  However, just to make sure, we went ahead and did a DaTscan.  That was normal.  Patient wanted to discuss this with me.    ALLERGIES:   Allergies  Allergen Reactions   Penicillin G Other (See Comments)    unknown   Penicillins Other (See Comments)    Unknown childhood allergy    CURRENT MEDICATIONS:  No outpatient medications have been marked as taking for the 08/20/23 encounter (Video Visit) with Josuha Fontanez, Octaviano Batty, DO.     Objective:   PHYSICAL EXAMINATION:    VITALS:   There were no vitals filed for this visit.   GEN:  The patient appears stated age and is in NAD.  Neurological examination:  Orientation: The patient is alert and oriented x3. Cranial nerves: There is good facial symmetry with no significant facial hypomimia. The speech is fluent and clear.    Chemistry      Component Value Date/Time   NA 136 08/28/2022 1544   K 3.8 08/28/2022 1544   CL 105 08/28/2022 1544   CO2 19 (L) 08/28/2022 1544   BUN 19 08/28/2022 1544   CREATININE 1.12 08/28/2022 1544      Component Value Date/Time   CALCIUM 9.7 08/28/2022 1544       Lab Results  Component Value Date   WBC 3.8 (L) 12/19/2022   HGB 17.6 (H) 12/19/2022   HCT 51.1 12/19/2022  MCV 83.4 12/19/2022   PLT 165 12/19/2022    No results found for: "TSH"  Follow up Instructions      -I discussed the assessment and treatment plan with the patient. The patient was provided an opportunity to ask questions and all were answered. The patient agreed with the plan and demonstrated an understanding of the instructions.   The patient was advised to call back or seek an in-person evaluation if the symptoms worsen or if the condition fails to improve as anticipated.    Total time spent on today's visit was , including both face-to-face time and nonface-to-face time.  Time included that spent on review of records (prior notes  available to me/labs/imaging if pertinent), discussing treatment and goals, answering patient's questions and coordinating care.   Kerin Salen, DO    Cc:  Shon Hale, MD

## 2023-09-13 ENCOUNTER — Other Ambulatory Visit: Payer: Medicare Other | Admitting: Neurology

## 2023-11-04 ENCOUNTER — Other Ambulatory Visit: Payer: Self-pay | Admitting: Neurosurgery

## 2023-11-11 ENCOUNTER — Other Ambulatory Visit: Payer: Self-pay | Admitting: Neurosurgery

## 2023-12-18 ENCOUNTER — Encounter (HOSPITAL_COMMUNITY): Payer: Self-pay

## 2023-12-18 NOTE — Progress Notes (Signed)
Surgical Instructions   Your procedure is scheduled on Tuesday December 24, 2023. Report to [facility] Main Entrance "A" at 5:30 A.M., then check in with the Admitting office. Any questions or running late day of surgery: call [phone]  Questions prior to your surgery date: call [phone], Monday-Friday, 8am-4pm. If you experience any cold or flu symptoms such as cough, fever, chills, shortness of breath, etc. between now and your scheduled surgery, please notify us at the above number.     Remember:  Do not eat or drink after midnight the night before your surgery   Take these medicines the morning of surgery with A SIP OF WATER  Deutetrabenazine (AUSTEDO)  gabapentin (NEURONTIN)   May take these medicines IF NEEDED: ALPRAZolam (XANAX)  oxyCODONE-acetaminophen (PERCOCET/ROXICET)    One week prior to surgery, STOP taking any Aspirin (unless otherwise instructed by your surgeon) Aleve, Naproxen, Ibuprofen, Motrin, Advil, Goody's, BC's, all herbal medications, fish oil, and non-prescription vitamins.                     Do NOT Smoke (Tobacco/Vaping) for 24 hours prior to your procedure.  If you use a CPAP at night, you may bring your mask/headgear for your overnight stay.   You will be asked to remove any contacts, glasses, piercing's, hearing aid's, dentures/partials prior to surgery. Please bring cases for these items if needed.    Patients discharged the day of surgery will not be allowed to drive home, and someone needs to stay with them for 24 hours.  SURGICAL WAITING ROOM VISITATION Patients may have no more than 2 support people in the waiting area - these visitors may rotate.   Pre-op nurse will coordinate an appropriate time for 1 ADULT support person, who may not rotate, to accompany patient in pre-op.  Children under the age of 65 must have an adult with them who is not the patient and must remain in the main waiting area with an adult.  If the patient needs to  stay at the hospital during part of their recovery, the visitor guidelines for inpatient rooms apply.  Please refer to the [facility] website for the visitor guidelines for any additional information.   If you received a COVID test during your pre-op visit  it is requested that you wear a mask when out in public, stay away from anyone that may not be feeling well and notify your surgeon if you develop symptoms. If you have been in contact with anyone that has tested positive in the last 10 days please notify you surgeon.      Pre-operative 5 CHG Bathing Instructions   You can play a key role in reducing the risk of infection after surgery. Your skin needs to be as free of germs as possible. You can reduce the number of germs on your skin by washing with CHG (chlorhexidine gluconate) soap before surgery. CHG is an antiseptic soap that kills germs and continues to kill germs even after washing.   DO NOT use if you have an allergy to chlorhexidine/CHG or antibacterial soaps. If your skin becomes reddened or irritated, stop using the CHG and notify one of our RNs at [phone].   Please shower with the CHG soap starting 4 days before surgery using the following schedule:     Please keep in mind the following:  DO NOT shave, including legs and underarms, starting the day of your first shower.   You may shave your face at any  point before/day of surgery.  Place clean sheets on your bed the day you start using CHG soap. Use a clean washcloth (not used since being washed) for each shower. DO NOT sleep with pets once you start using the CHG.   CHG Shower Instructions:  Wash your face and private area with normal soap. If you choose to wash your hair, wash first with your normal shampoo.  After you use shampoo/soap, rinse your hair and body thoroughly to remove shampoo/soap residue.  Turn the water OFF and apply about 3 tablespoons (45 ml) of CHG soap to a CLEAN washcloth.  Apply CHG soap  ONLY FROM YOUR NECK DOWN TO YOUR TOES (washing for 3-5 minutes)  DO NOT use CHG soap on face, private areas, open wounds, or sores.  Pay special attention to the area where your surgery is being performed.  If you are having back surgery, having someone wash your back for you may be helpful. Wait 2 minutes after CHG soap is applied, then you may rinse off the CHG soap.  Pat dry with a clean towel  Put on clean clothes/pajamas   If you choose to wear lotion, please use ONLY the CHG-compatible lotions that are listed below.  Additional instructions for the day of surgery: DO NOT APPLY any lotions, deodorants or cologne.    Do not bring valuables to the hospital. [facility] is not responsible for any belongings/valuables. Do not wear jewelry Put on clean/comfortable clothes.  Please brush your teeth.  Ask your nurse before applying any prescription medications to the skin.     CHG Compatible Lotions   Aveeno Moisturizing lotion  Cetaphil Moisturizing Cream  Cetaphil Moisturizing Lotion  Clairol Herbal Essence Moisturizing Lotion, Dry Skin  Clairol Herbal Essence Moisturizing Lotion, Extra Dry Skin  Clairol Herbal Essence Moisturizing Lotion, Normal Skin  Curel Age Defying Therapeutic Moisturizing Lotion with Alpha Hydroxy  Curel Extreme Care Body Lotion  Curel Soothing Hands Moisturizing Hand Lotion  Curel Therapeutic Moisturizing Cream, Fragrance-Free  Curel Therapeutic Moisturizing Lotion, Fragrance-Free  Curel Therapeutic Moisturizing Lotion, Original Formula  Eucerin Daily Replenishing Lotion  Eucerin Dry Skin Therapy Plus Alpha Hydroxy Crme  Eucerin Dry Skin Therapy Plus Alpha Hydroxy Lotion  Eucerin Original Crme  Eucerin Original Lotion  Eucerin Plus Crme Eucerin Plus Lotion  Eucerin TriLipid Replenishing Lotion  Keri Anti-Bacterial Hand Lotion  Keri Deep Conditioning Original Lotion Dry Skin Formula Softly Scented  Keri Deep Conditioning Original Lotion, Fragrance  Free Sensitive Skin Formula  Keri Lotion Fast Absorbing Fragrance Free Sensitive Skin Formula  Keri Lotion Fast Absorbing Softly Scented Dry Skin Formula  Keri Original Lotion  Keri Skin Renewal Lotion Keri Silky Smooth Lotion  Keri Silky Smooth Sensitive Skin Lotion  Nivea Body Creamy Conditioning Oil  Nivea Body Extra Enriched Lotion  Nivea Body Original Lotion  Nivea Body Sheer Moisturizing Lotion Nivea Crme  Nivea Skin Firming Lotion  NutraDerm 30 Skin Lotion  NutraDerm Skin Lotion  NutraDerm Therapeutic Skin Cream  NutraDerm Therapeutic Skin Lotion  ProShield Protective Hand Cream  Provon moisturizing lotion  Please read over the following fact sheets that you were given.

## 2023-12-19 ENCOUNTER — Encounter (HOSPITAL_COMMUNITY): Payer: Self-pay

## 2023-12-19 ENCOUNTER — Encounter (HOSPITAL_COMMUNITY)
Admission: RE | Admit: 2023-12-19 | Discharge: 2023-12-19 | Disposition: A | Discharge status: Home / Self Care | Payer: Medicare Other | Source: Ambulatory Visit | Attending: Neurosurgery | Admitting: Neurosurgery

## 2023-12-19 ENCOUNTER — Other Ambulatory Visit: Payer: Self-pay

## 2023-12-19 VITALS — BP 152/75 | HR 80 | Temp 98.4°F | Resp 17 | Ht 72.0 in | Wt 221.8 lb

## 2023-12-19 DIAGNOSIS — Z01812 Encounter for preprocedural laboratory examination: Secondary | ICD-10-CM | POA: Diagnosis present

## 2023-12-19 DIAGNOSIS — D582 Other hemoglobinopathies: Secondary | ICD-10-CM | POA: Diagnosis not present

## 2023-12-19 DIAGNOSIS — Z01818 Encounter for other preprocedural examination: Secondary | ICD-10-CM

## 2023-12-19 HISTORY — DX: Prediabetes: R73.03

## 2023-12-19 HISTORY — DX: Unspecified osteoarthritis, unspecified site: M19.90

## 2023-12-19 HISTORY — DX: Dyspnea, unspecified: R06.00

## 2023-12-19 HISTORY — DX: Drug induced subacute dyskinesia: G24.01

## 2023-12-19 HISTORY — DX: Anxiety disorder, unspecified: F41.9

## 2023-12-19 HISTORY — DX: Unspecified convulsions: R56.9

## 2023-12-19 LAB — SURGICAL PCR SCREEN
MRSA, PCR: NEGATIVE
Staphylococcus aureus: POSITIVE — AB

## 2023-12-19 LAB — CBC
HCT: 49.4 % (ref 39.0–52.0)
Hemoglobin: 17.9 g/dL — ABNORMAL HIGH (ref 13.0–17.0)
MCH: 31.1 pg (ref 26.0–34.0)
MCHC: 36.2 g/dL — ABNORMAL HIGH (ref 30.0–36.0)
MCV: 85.9 fL (ref 80.0–100.0)
Platelets: 180 10*3/uL (ref 150–400)
RBC: 5.75 MIL/uL (ref 4.22–5.81)
RDW: 12.4 % (ref 11.5–15.5)
WBC: 3.1 10*3/uL — ABNORMAL LOW (ref 4.0–10.5)
nRBC: 0 % (ref 0.0–0.2)

## 2023-12-19 LAB — TYPE AND SCREEN
ABO/RH(D): O POS
Antibody Screen: NEGATIVE

## 2023-12-19 NOTE — Progress Notes (Signed)
PCP - Meridee Score. Chanetta Marshall, MD Cardiologist - Lance Muss, MD Neurologist: Kerin Salen, DO  PPM/ICD - Denies  Chest x-ray - Denies EKG - Denies Stress Test - Denies ECHO - 11/12/2022 Cardiac Cath - Denies  OSA: Positive  DM: Prediabetic   Blood Thinner Instructions: N/A Aspirin Instructions: N/A  ERAS Protcol - NPO PRE-SURGERY Ensure or G2-   COVID TEST- N/A   Anesthesia review: Yes, history of seizures.  Patient denies shortness of breath, fever, cough and chest pain at PAT appointment   All instructions explained to the patient, with a verbal understanding of the material. Patient agrees to go over the instructions while at home for a better understanding.The opportunity to ask questions was provided.

## 2023-12-20 NOTE — Anesthesia Preprocedure Evaluation (Signed)
Anesthesia Evaluation  Patient identified by MRN, date of birth, ID band Patient awake    Reviewed: Allergy & Precautions, NPO status , Patient's Chart, lab work & pertinent test results  Airway Mallampati: III  TM Distance: >3 FB Neck ROM: Full    Dental no notable dental hx.    Pulmonary sleep apnea and Continuous Positive Airway Pressure Ventilation    Pulmonary exam normal        Cardiovascular negative cardio ROS Normal cardiovascular exam     Neuro/Psych  PSYCHIATRIC DISORDERS Anxiety Depression Bipolar Disorder      GI/Hepatic negative GI ROS,,,(+)     substance abuse    Endo/Other  negative endocrine ROS    Renal/GU negative Renal ROS     Musculoskeletal  (+) Arthritis ,  narcotic dependent  Abdominal   Peds  Hematology negative hematology ROS (+)   Anesthesia Other Findings RADICULOPATHY, LUMBAR REGION  Reproductive/Obstetrics                             Anesthesia Physical Anesthesia Plan  ASA: 3  Anesthesia Plan: General   Post-op Pain Management: Ketamine IV*   Induction: Intravenous  PONV Risk Score and Plan: 2 and Ondansetron, Dexamethasone, Midazolam and Treatment may vary due to age or medical condition  Airway Management Planned: Oral ETT  Additional Equipment:   Intra-op Plan:   Post-operative Plan: Extubation in OR  Informed Consent: I have reviewed the patients History and Physical, chart, labs and discussed the procedure including the risks, benefits and alternatives for the proposed anesthesia with the patient or authorized representative who has indicated his/her understanding and acceptance.     Dental advisory given  Plan Discussed with: CRNA  Anesthesia Plan Comments: (PAT note written 12/20/2023 by Shonna Chock, PA-C.  )       Anesthesia Quick Evaluation

## 2023-12-20 NOTE — Progress Notes (Signed)
Anesthesia Chart Review:  Case: [id number] Date/Time: 12/24/23 0715   Procedures:      MIS TLIF, L5S1 (Left) - 3C     Application of O-Arm (Left)   Anesthesia type: General   Pre-op diagnosis: RADICULOPATHY, LUMBAR REGION   [facility]: [facility] ROOM 19 / [facility] OR   Surgeons: Bedelia Person, MD       DISCUSSION: Patient is a 63 year old male scheduled for the above procedure.  History includes never smoker, HLD, pre-diabetes, OSA (uses CPAP), dyspnea, GERD, Bipolar 2 disorder, seizures, tardive dyskinesis (on Austedo), spinal surgery (left L5-S1 microdiskectomy on 05/23/23).    Evaluated by cardiologist Dr. Eldridge Dace in November 2023 for dyspnea, "hard exhale". Referred by PCP after ED visit in October 2023 with reassuring work-up--CTA was negative for PE. Troponins x2 and BNP were normal. H/H elevated ~ 18/52. EKG showed NSR, LAD. Varanasi recommended improved lifestyle changes with walking and whole food, high fiber, plant based died. He also discussed need for hematology evaluation given elevated HGB and some concern for hemochromatosis and consideration of adding metformin to delay onset of diabetes (A1c 6.3%). He preferred to discuss metformin with PCP. Echo was also ordered and done on 11/12/22 showing LVEF 50-55%, no regional wall motion abnormalities, mild LVH, normal RV systolic function, trivial MR. He did not schedule follow-up.   He has since been evaluated by Lonna Cobb, NP at the [facility] for polycythemia. Last visit was on 12/19/22. 10/22/2022 erythropoietin level normal.  11/07/2022 JAK2 mutation testing negative.  She thought polycythemia thought likely related to testosterone therapy and/or sleep apnea.  He did not wish to stop testosterone. He remained stable from a hematologic standpoint. Repeat CBC in 3 months recommended. He did not schedule follow-up.   He is on multiple psychotropic and/or anti seizure medications. Medications include Xanax 0.5 mg QID as needed, Tegretol 400 mg Q HS,  Austedo BID, gabapentin TID, Lamictal 100 mg Q HS. He is also on Ambien 10 mg Q HS, weekly testosterone injections, pravastatin, as needed Percocet.  He was referred to neurologist Dr. Arbutus Leas in 2022 for tremor, likely related to medications. Also diagnosed with tardive dyskinesia, improved on Austedo but could also produce parkinsonism, as well as some other medications he was on. Last visit 08/20/23. He had undergone a NM Brain DaTSCAN on 08/16/23 with normal findings and nothing to suggest Parkinsonian syndrome. His tardive dyskinesia symptoms worsened when he tried to stop Austedo. Continued medication management per his psychiatrist with as needed neurology follow-up recommended.   He underwent left L5-S1 microdiskectomy on 05/23/23. He had recurrent LLE symptoms with pain and some numbness. Imaging revealed recurrent disc herniation. Given quickness of recurrence, more definitive MIS TLIF surgery planned.   He had back surgery 7 months ago. He denied chest pain and SOB at PAT RN visit. Anesthesia team to evaluate on the day of surgery. "Pre" diabetes history reported, but no formal DM or HTN diagnosis. Last BMP noted is from 08/28/22. Will defer to anesthesiologist if any additional labs desired on the day of surgery. H/H 17.9/49.4, consistent with prior results/history.   VS: BP (!) 152/75   Pulse 80   Temp 36.9 C   Resp 17   Ht 6' (1.829 m)   Wt 100.6 kg   SpO2 97%   BMI 30.08 kg/m    PROVIDERS: Shon Hale, MD is PCP - Lance Muss, MD is cardiologist - Tat, Lurena Joiner, DO is neurologist - Milagros Evener, MD is psychiatrist  - Lonna Cobb, NP  is hematology provider - Kerin Salen, MD is GI   LABS: Preoperative labs noted. H/H 17.9/49.4, consistent with prior results/history. (all labs ordered are listed, but only abnormal results are displayed)  Labs Reviewed  SURGICAL PCR SCREEN - Abnormal; Notable for the following components:      Result Value   Staphylococcus  aureus POSITIVE (*)    All other components within normal limits  CBC - Abnormal; Notable for the following components:   WBC 3.1 (*)    Hemoglobin 17.9 (*)    MCHC 36.2 (*)    All other components within normal limits  TYPE AND SCREEN    IMAGES: MRI L-spine 10/22/23 (Canopy/PACS): IMPRESSION: 1. 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 Same numbering as on the prior MRI. Correlation with radiographs is recommended prior to any operative intervention. 2. At S1-S2, recurrent/residual disc material in the left subarticular recess which extends superiorly and abuts the descending left S2 nerve roots. Surrounding scar tissue.  NM Brain DaTSCAN 08/16/23: IMPRESSION: Ioflupane scan within normal limits. No reduced radiotracer activity in basal ganglia to suggest Parkinson's syndrome pathology.   Of note, DaTSCAN is not diagnostic of Parkinsonian syndromes, which remains a clinical diagnosis. DaTscan is an adjuvant test to aid in the clinical diagnosis of Parkinsonian syndromes.   EKG: 10/17/22: NSR   CV: Echo 11/12/22: IMPRESSIONS   1. Left ventricular ejection fraction, by estimation, is 50 to 55%. The  left ventricle has low normal function. The left ventricle has no regional  wall motion abnormalities. There is mild left ventricular hypertrophy.  Left ventricular diastolic  parameters are indeterminate.   2. Right ventricular systolic function is normal. The right ventricular  size is normal.   3. The mitral valve is normal in structure. Trivial mitral valve  regurgitation.   4. The aortic valve is tricuspid. Aortic valve regurgitation is not  visualized.    Past Medical History:  Diagnosis Date   Allergy    Anxiety    Arthritis    Bipolar 2 disorder (HCC)    Depression    Dyspnea    GERD (gastroesophageal reflux disease)    Hyperlipidemia    Pre-diabetes    Seizures (HCC)    Sleep apnea    Tardive dyskinesia     Past  Surgical History:  Procedure Laterality Date   LUMBAR MICRODISCECTOMY  2024   TOOTH EXTRACTION Right 2022   WISDOM TOOTH EXTRACTION Bilateral 1985    MEDICATIONS:  ALPRAZolam (XANAX) 0.5 MG tablet   carbamazepine (TEGRETOL) 200 MG tablet   Deutetrabenazine (AUSTEDO) 12 MG TABS   Deutetrabenazine (AUSTEDO) 6 MG TABS   gabapentin (NEURONTIN) 100 MG capsule   lamoTRIgine (LAMICTAL) 100 MG tablet   NON FORMULARY   oxyCODONE-acetaminophen (PERCOCET/ROXICET) 5-325 MG tablet   pravastatin (PRAVACHOL) 20 MG tablet   testosterone cypionate (DEPOTESTOSTERONE CYPIONATE) 200 MG/ML injection   zolpidem (AMBIEN) 10 MG tablet   No current facility-administered medications for this encounter.    Shonna Chock, PA-C Surgical Short Stay/Anesthesiology [facility] Phone [phone] [facility] Phone [phone] 12/20/2023 2:34 PM

## 2023-12-24 ENCOUNTER — Observation Stay (HOSPITAL_COMMUNITY)
Admission: RE | Admit: 2023-12-24 | Discharge: 2023-12-25 | Disposition: A | Discharge status: Home / Self Care | Payer: Medicare Other | Attending: Neurosurgery | Admitting: Neurosurgery

## 2023-12-24 ENCOUNTER — Ambulatory Visit (HOSPITAL_COMMUNITY): Payer: Medicare Other

## 2023-12-24 ENCOUNTER — Ambulatory Visit (HOSPITAL_COMMUNITY): Payer: Self-pay | Admitting: Vascular Surgery

## 2023-12-24 ENCOUNTER — Encounter (HOSPITAL_COMMUNITY): Payer: Self-pay

## 2023-12-24 ENCOUNTER — Other Ambulatory Visit: Payer: Self-pay

## 2023-12-24 ENCOUNTER — Ambulatory Visit (HOSPITAL_BASED_OUTPATIENT_CLINIC_OR_DEPARTMENT_OTHER): Payer: Self-pay | Admitting: Anesthesiology

## 2023-12-24 ENCOUNTER — Encounter (HOSPITAL_COMMUNITY)
Admission: RE | Disposition: A | Discharge status: Home / Self Care | Payer: Self-pay | Source: Home / Self Care | Attending: Neurosurgery

## 2023-12-24 DIAGNOSIS — M5117 Intervertebral disc disorders with radiculopathy, lumbosacral region: Secondary | ICD-10-CM | POA: Diagnosis present

## 2023-12-24 DIAGNOSIS — Z79899 Other long term (current) drug therapy: Secondary | ICD-10-CM | POA: Insufficient documentation

## 2023-12-24 DIAGNOSIS — M5416 Radiculopathy, lumbar region: Principal | ICD-10-CM | POA: Diagnosis present

## 2023-12-24 DIAGNOSIS — R2681 Unsteadiness on feet: Secondary | ICD-10-CM | POA: Insufficient documentation

## 2023-12-24 DIAGNOSIS — M6281 Muscle weakness (generalized): Secondary | ICD-10-CM | POA: Diagnosis not present

## 2023-12-24 HISTORY — PX: TRANSFORAMINAL LUMBAR INTERBODY FUSION W/ MIS 1 LEVEL: SHX6145

## 2023-12-24 LAB — BASIC METABOLIC PANEL
Anion gap: 9 (ref 5–15)
BUN: 15 mg/dL (ref 8–23)
CO2: 26 mmol/L (ref 22–32)
Calcium: 9 mg/dL (ref 8.9–10.3)
Chloride: 101 mmol/L (ref 98–111)
Creatinine, Ser: 0.86 mg/dL (ref 0.61–1.24)
GFR, Estimated: >60 mL/min (ref >60)
Glucose, Bld: 237 mg/dL — ABNORMAL HIGH (ref 70–99)
Potassium: 4 mmol/L (ref 3.5–5.1)
Sodium: 136 mmol/L (ref 135–145)

## 2023-12-24 LAB — ABO/RH: ABO/RH(D): O POS

## 2023-12-24 SURGERY — MINIMALLY INVASIVE (MIS) TRANSFORAMINAL LUMBAR INTERBODY FUSION (TLIF) 1 LEVEL
Anesthesia: General | Site: Back | Laterality: Left

## 2023-12-24 MED ORDER — OXYCODONE HCL 5 MG/5ML PO SOLN: 5.0000 mg | Freq: Once | ORAL | Status: AC | PRN

## 2023-12-24 MED ORDER — EPHEDRINE SULFATE-NACL 50-0.9 MG/10ML-% IV SOSY: PREFILLED_SYRINGE | INTRAVENOUS | Status: DC | PRN

## 2023-12-24 MED ORDER — OXYCODONE HCL 5 MG PO TABS
ORAL_TABLET | ORAL | Status: AC
  Filled 2023-12-24: qty 1

## 2023-12-24 MED ORDER — PHENYLEPHRINE 80 MCG/ML (10ML) SYRINGE FOR IV PUSH (FOR BLOOD PRESSURE SUPPORT)
PREFILLED_SYRINGE | INTRAVENOUS | Status: AC
Start: 2023-12-24 — End: ?
  Filled 2023-12-24: qty 10

## 2023-12-24 MED ORDER — PHENYLEPHRINE HCL-NACL 20-0.9 MG/250ML-% IV SOLN
INTRAVENOUS | Status: DC | PRN
  Administered 2023-12-24: 25 ug/min via INTRAVENOUS

## 2023-12-24 MED ORDER — GABAPENTIN 100 MG PO CAPS
100.0000 mg | ORAL_CAPSULE | Freq: Three times a day (TID) | ORAL | Status: DC
  Administered 2023-12-24 – 2023-12-25 (×3): 100 mg via ORAL
  Filled 2023-12-24 (×3): qty 1

## 2023-12-24 MED ORDER — SODIUM CHLORIDE 0.9% FLUSH
3.0000 mL | Freq: Two times a day (BID) | INTRAVENOUS | Status: DC
  Administered 2023-12-24 (×2): 3 mL via INTRAVENOUS

## 2023-12-24 MED ORDER — VASOPRESSIN 20 UNIT/ML IV SOLN
INTRAVENOUS | Status: AC
  Filled 2023-12-24: qty 1

## 2023-12-24 MED ORDER — ROCURONIUM BROMIDE 10 MG/ML (PF) SYRINGE
PREFILLED_SYRINGE | INTRAVENOUS | Status: AC
  Filled 2023-12-24: qty 10

## 2023-12-24 MED ORDER — SODIUM CHLORIDE 0.9% FLUSH: 3.0000 mL | INTRAVENOUS | Status: DC | PRN

## 2023-12-24 MED ORDER — DEUTETRABENAZINE 6 MG PO TABS
6.0000 mg | ORAL_TABLET | Freq: Two times a day (BID) | ORAL | Status: DC
  Administered 2023-12-24 – 2023-12-25 (×2): 6 mg via ORAL

## 2023-12-24 MED ORDER — OXYCODONE HCL 5 MG PO TABS
5.0000 mg | ORAL_TABLET | Freq: Once | ORAL | Status: AC | PRN
Start: 2023-12-24 — End: 2023-12-24
  Administered 2023-12-24: 5 mg via ORAL

## 2023-12-24 MED ORDER — LACTATED RINGERS IV SOLN: INTRAVENOUS | Status: DC | PRN

## 2023-12-24 MED ORDER — BUPIVACAINE LIPOSOME 1.3 % IJ SUSP
INTRAMUSCULAR | Status: DC | PRN
  Administered 2023-12-24: 20 mL

## 2023-12-24 MED ORDER — VANCOMYCIN HCL 1500 MG/300ML IV SOLN: 1500.0000 mg | INTRAVENOUS | Status: AC

## 2023-12-24 MED ORDER — THROMBIN 5000 UNITS EX SOLR
OROMUCOSAL | Status: DC | PRN
  Administered 2023-12-24: 5 mL via TOPICAL

## 2023-12-24 MED ORDER — DOCUSATE SODIUM 100 MG PO CAPS
100.0000 mg | ORAL_CAPSULE | Freq: Two times a day (BID) | ORAL | Status: DC
  Administered 2023-12-24 – 2023-12-25 (×3): 100 mg via ORAL
  Filled 2023-12-24 (×3): qty 1

## 2023-12-24 MED ORDER — SODIUM CHLORIDE 0.9% FLUSH: 3.0000 mL | Freq: Two times a day (BID) | INTRAVENOUS | Status: DC

## 2023-12-24 MED ORDER — FENTANYL CITRATE (PF) 100 MCG/2ML IJ SOLN
25.0000 ug | INTRAMUSCULAR | Status: DC | PRN
  Administered 2023-12-24 (×3): 50 ug via INTRAVENOUS

## 2023-12-24 MED ORDER — DEXAMETHASONE SODIUM PHOSPHATE 10 MG/ML IJ SOLN
INTRAMUSCULAR | Status: AC
  Filled 2023-12-24: qty 1

## 2023-12-24 MED ORDER — PRAVASTATIN SODIUM 10 MG PO TABS
20.0000 mg | ORAL_TABLET | Freq: Every evening | ORAL | Status: DC
  Administered 2023-12-24: 20 mg via ORAL
  Filled 2023-12-24: qty 2

## 2023-12-24 MED ORDER — KETOROLAC TROMETHAMINE 15 MG/ML IJ SOLN
15.0000 mg | Freq: Four times a day (QID) | INTRAMUSCULAR | Status: AC
  Administered 2023-12-24 – 2023-12-25 (×4): 15 mg via INTRAVENOUS
  Filled 2023-12-24 (×4): qty 1

## 2023-12-24 MED ORDER — MENTHOL 3 MG MT LOZG: 1.0000 | LOZENGE | OROMUCOSAL | Status: DC | PRN

## 2023-12-24 MED ORDER — ACETAMINOPHEN 10 MG/ML IV SOLN
INTRAVENOUS | Status: AC
  Filled 2023-12-24: qty 100

## 2023-12-24 MED ORDER — LIDOCAINE 2% (20 MG/ML) 5 ML SYRINGE
INTRAMUSCULAR | Status: DC | PRN
  Administered 2023-12-24: 100 mg via INTRAVENOUS

## 2023-12-24 MED ORDER — CHLORHEXIDINE GLUCONATE CLOTH 2 % EX PADS: 6.0000 | MEDICATED_PAD | Freq: Once | CUTANEOUS | Status: DC

## 2023-12-24 MED ORDER — ONDANSETRON HCL 4 MG/2ML IJ SOLN: 4.0000 mg | Freq: Four times a day (QID) | INTRAMUSCULAR | Status: DC | PRN

## 2023-12-24 MED ORDER — MUPIROCIN 2 % EX OINT: 1.0000 | TOPICAL_OINTMENT | Freq: Two times a day (BID) | CUTANEOUS | 0 refills | Status: AC

## 2023-12-24 MED ORDER — FENTANYL CITRATE (PF) 250 MCG/5ML IJ SOLN
INTRAMUSCULAR | Status: AC
  Filled 2023-12-24: qty 5

## 2023-12-24 MED ORDER — FENTANYL CITRATE (PF) 100 MCG/2ML IJ SOLN
INTRAMUSCULAR | Status: AC
  Filled 2023-12-24: qty 2

## 2023-12-24 MED ORDER — ONDANSETRON HCL 4 MG PO TABS: 4.0000 mg | ORAL_TABLET | Freq: Four times a day (QID) | ORAL | Status: DC | PRN

## 2023-12-24 MED ORDER — VANCOMYCIN HCL IN DEXTROSE 1-5 GM/200ML-% IV SOLN
1000.0000 mg | Freq: Once | INTRAVENOUS | Status: AC
  Administered 2023-12-24: 1000 mg via INTRAVENOUS
  Filled 2023-12-24: qty 200

## 2023-12-24 MED ORDER — HYDROMORPHONE HCL 1 MG/ML IJ SOLN
0.5000 mg | INTRAMUSCULAR | Status: DC | PRN
  Administered 2023-12-24: 0.5 mg via INTRAVENOUS
  Filled 2023-12-24: qty 0.5

## 2023-12-24 MED ORDER — FLEET ENEMA RE ENEM: 1.0000 | ENEMA | Freq: Once | RECTAL | Status: DC | PRN

## 2023-12-24 MED ORDER — EPHEDRINE 5 MG/ML INJ
INTRAVENOUS | Status: AC
Start: 2023-12-24 — End: ?
  Filled 2023-12-24: qty 5

## 2023-12-24 MED ORDER — LAMOTRIGINE 25 MG PO TABS
125.0000 mg | ORAL_TABLET | Freq: Every day | ORAL | Status: DC
  Administered 2023-12-24: 125 mg via ORAL
  Filled 2023-12-24: qty 1

## 2023-12-24 MED ORDER — CARBAMAZEPINE 200 MG PO TABS
400.0000 mg | ORAL_TABLET | Freq: Every day | ORAL | Status: DC
  Administered 2023-12-24: 400 mg via ORAL
  Filled 2023-12-24: qty 2

## 2023-12-24 MED ORDER — SUGAMMADEX SODIUM 200 MG/2ML IV SOLN
INTRAVENOUS | Status: DC | PRN
  Administered 2023-12-24: 200 mg via INTRAVENOUS

## 2023-12-24 MED ORDER — ACETAMINOPHEN 10 MG/ML IV SOLN
1000.0000 mg | Freq: Once | INTRAVENOUS | Status: DC | PRN
  Administered 2023-12-24: 1000 mg via INTRAVENOUS

## 2023-12-24 MED ORDER — DEXAMETHASONE SODIUM PHOSPHATE 10 MG/ML IJ SOLN
INTRAMUSCULAR | Status: DC | PRN
  Administered 2023-12-24: 10 mg via INTRAVENOUS

## 2023-12-24 MED ORDER — OXYCODONE HCL 5 MG PO TABS
10.0000 mg | ORAL_TABLET | ORAL | Status: DC | PRN
  Administered 2023-12-24 – 2023-12-25 (×5): 10 mg via ORAL
  Filled 2023-12-24 (×5): qty 2

## 2023-12-24 MED ORDER — CHLORHEXIDINE GLUCONATE 0.12 % MT SOLN: 15.0000 mL | Freq: Once | OROMUCOSAL | Status: AC

## 2023-12-24 MED ORDER — LACTATED RINGERS IV SOLN: Freq: Once | INTRAVENOUS | Status: AC

## 2023-12-24 MED ORDER — AMISULPRIDE (ANTIEMETIC) 5 MG/2ML IV SOLN: 10.0000 mg | Freq: Once | INTRAVENOUS | Status: DC | PRN

## 2023-12-24 MED ORDER — BUPIVACAINE HCL (PF) 0.5 % IJ SOLN
INTRAMUSCULAR | Status: AC
  Filled 2023-12-24: qty 30

## 2023-12-24 MED ORDER — METHOCARBAMOL 500 MG PO TABS
500.0000 mg | ORAL_TABLET | Freq: Four times a day (QID) | ORAL | Status: DC | PRN
  Administered 2023-12-24 – 2023-12-25 (×3): 500 mg via ORAL
  Filled 2023-12-24 (×3): qty 1

## 2023-12-24 MED ORDER — EPHEDRINE SULFATE-NACL 50-0.9 MG/10ML-% IV SOSY
PREFILLED_SYRINGE | INTRAVENOUS | Status: DC | PRN
  Administered 2023-12-24: 5 mg via INTRAVENOUS

## 2023-12-24 MED ORDER — FENTANYL CITRATE (PF) 250 MCG/5ML IJ SOLN
INTRAMUSCULAR | Status: DC | PRN
  Administered 2023-12-24 (×2): 50 ug via INTRAVENOUS

## 2023-12-24 MED ORDER — BUPIVACAINE LIPOSOME 1.3 % IJ SUSP
INTRAMUSCULAR | Status: AC
  Filled 2023-12-24: qty 20

## 2023-12-24 MED ORDER — PROPOFOL 10 MG/ML IV BOLUS
INTRAVENOUS | Status: DC | PRN
  Administered 2023-12-24: 150 mg via INTRAVENOUS

## 2023-12-24 MED ORDER — ACETAMINOPHEN 325 MG PO TABS: 650.0000 mg | ORAL_TABLET | ORAL | Status: DC | PRN

## 2023-12-24 MED ORDER — PROPOFOL 10 MG/ML IV BOLUS
INTRAVENOUS | Status: AC
  Filled 2023-12-24: qty 20

## 2023-12-24 MED ORDER — ROCURONIUM BROMIDE 10 MG/ML (PF) SYRINGE
PREFILLED_SYRINGE | INTRAVENOUS | Status: DC | PRN
  Administered 2023-12-24: 60 mg via INTRAVENOUS
  Administered 2023-12-24 (×3): 40 mg via INTRAVENOUS

## 2023-12-24 MED ORDER — ONDANSETRON HCL 4 MG/2ML IJ SOLN
INTRAMUSCULAR | Status: DC | PRN
  Administered 2023-12-24: 4 mg via INTRAVENOUS

## 2023-12-24 MED ORDER — BUPIVACAINE HCL (PF) 0.5 % IJ SOLN
INTRAMUSCULAR | Status: DC | PRN
  Administered 2023-12-24: 20 mL
  Administered 2023-12-24: 8 mL

## 2023-12-24 MED ORDER — METHOCARBAMOL 1000 MG/10ML IJ SOLN: 500.0000 mg | Freq: Four times a day (QID) | INTRAMUSCULAR | Status: DC | PRN

## 2023-12-24 MED ORDER — ACETAMINOPHEN 650 MG RE SUPP: 650.0000 mg | RECTAL | Status: DC | PRN

## 2023-12-24 MED ORDER — LIDOCAINE 2% (20 MG/ML) 5 ML SYRINGE
INTRAMUSCULAR | Status: AC
Start: 2023-12-24 — End: ?
  Filled 2023-12-24: qty 5

## 2023-12-24 MED ORDER — LIDOCAINE-EPINEPHRINE 1 %-1:100000 IJ SOLN
INTRAMUSCULAR | Status: DC | PRN
  Administered 2023-12-24: 8 mL

## 2023-12-24 MED ORDER — ALPRAZOLAM 0.5 MG PO TABS
0.5000 mg | ORAL_TABLET | Freq: Four times a day (QID) | ORAL | Status: DC | PRN
  Administered 2023-12-24: 0.5 mg via ORAL
  Filled 2023-12-24: qty 1

## 2023-12-24 MED ORDER — POLYETHYLENE GLYCOL 3350 17 G PO PACK: 17.0000 g | PACK | Freq: Every day | ORAL | Status: DC | PRN

## 2023-12-24 MED ORDER — THROMBIN 5000 UNITS EX SOLR
CUTANEOUS | Status: AC
  Filled 2023-12-24: qty 5000

## 2023-12-24 MED ORDER — ONDANSETRON HCL 4 MG/2ML IJ SOLN
INTRAMUSCULAR | Status: AC
  Filled 2023-12-24: qty 2

## 2023-12-24 MED ORDER — LIDOCAINE-EPINEPHRINE 1 %-1:100000 IJ SOLN
INTRAMUSCULAR | Status: AC
  Filled 2023-12-24: qty 1

## 2023-12-24 MED ORDER — ORAL CARE MOUTH RINSE: 15.0000 mL | Freq: Once | OROMUCOSAL | Status: AC

## 2023-12-24 MED ORDER — CHLORHEXIDINE GLUCONATE 0.12 % MT SOLN
OROMUCOSAL | Status: AC
  Administered 2023-12-24: 15 mL via OROMUCOSAL
  Filled 2023-12-24: qty 15

## 2023-12-24 MED ORDER — DEUTETRABENAZINE 12 MG PO TABS
12.0000 mg | ORAL_TABLET | Freq: Two times a day (BID) | ORAL | Status: DC
  Administered 2023-12-25: 12 mg via ORAL

## 2023-12-24 MED ORDER — 0.9 % SODIUM CHLORIDE (POUR BTL) OPTIME
TOPICAL | Status: DC | PRN
  Administered 2023-12-24: 1000 mL

## 2023-12-24 MED ORDER — ZOLPIDEM TARTRATE 5 MG PO TABS
10.0000 mg | ORAL_TABLET | Freq: Every day | ORAL | Status: DC
  Administered 2023-12-24: 10 mg via ORAL
  Filled 2023-12-24: qty 2

## 2023-12-24 MED ORDER — SODIUM CHLORIDE 0.9 % IV SOLN
250.0000 mL | INTRAVENOUS | Status: DC
  Administered 2023-12-24: 250 mL via INTRAVENOUS

## 2023-12-24 MED ORDER — VANCOMYCIN HCL 1500 MG/300ML IV SOLN
INTRAVENOUS | Status: AC
  Administered 2023-12-24: 1500 mg via INTRAVENOUS
  Filled 2023-12-24: qty 300

## 2023-12-24 MED ORDER — PHENOL 1.4 % MT LIQD: 1.0000 | OROMUCOSAL | Status: DC | PRN

## 2023-12-24 MED ORDER — MIDAZOLAM HCL 2 MG/2ML IJ SOLN
INTRAMUSCULAR | Status: DC | PRN
  Administered 2023-12-24: 2 mg via INTRAVENOUS

## 2023-12-24 MED ORDER — OXYCODONE HCL 5 MG PO TABS
5.0000 mg | ORAL_TABLET | ORAL | Status: DC | PRN
  Administered 2023-12-24: 5 mg via ORAL
  Filled 2023-12-24: qty 1

## 2023-12-24 MED ORDER — MIDAZOLAM HCL 2 MG/2ML IJ SOLN
INTRAMUSCULAR | Status: AC
Start: 2023-12-24 — End: ?
  Filled 2023-12-24: qty 2

## 2023-12-24 SURGICAL SUPPLY — 79 items
BAG COUNTER SPONGE SURGICOUNT (BAG) ×2 IMPLANT
BAND RUBBER #18 3X1/16 STRL (MISCELLANEOUS) ×4 IMPLANT
BASKET BONE COLLECTION (BASKET) IMPLANT
BLADE CLIPPER SURG (BLADE) IMPLANT
BLADE SURG 11 STRL SS (BLADE) ×2 IMPLANT
BUR 14 MATCH 3 (BUR) IMPLANT
BUR CARBIDE MATCH 3.0 (BURR) IMPLANT
BUR MATCHSTICK NEURO 3.0 LAGG (BURR) IMPLANT
BUR MR8 14 BALL 5 (BUR) IMPLANT
BUR SURG IBUR 4X12.5 (BURR) ×2 IMPLANT
BURR 14 MATCH 3 (BUR) ×1 IMPLANT
BURR MR8 14 BALL 5 (BUR) IMPLANT
BURR SURG IBUR 4X12.5 (BURR) ×1 IMPLANT
CANISTER SUCT 3000ML PPV (MISCELLANEOUS) ×2 IMPLANT
CNTNR URN SCR LID CUP LEK RST (MISCELLANEOUS) ×2 IMPLANT
COVER BACK TABLE 60X90IN (DRAPES) ×2 IMPLANT
COVERAGE SUPPORT O-ARM STEALTH (MISCELLANEOUS) ×1 IMPLANT
DERMABOND ADVANCED .7 DNX12 (GAUZE/BANDAGES/DRESSINGS) ×4 IMPLANT
DRAIN JACKSON PRATT 10MM FLAT (MISCELLANEOUS) IMPLANT
DRAPE 3/4 80X56 (DRAPES) ×2 IMPLANT
DRAPE C-ARM 42X72 X-RAY (DRAPES) ×4 IMPLANT
DRAPE C-ARMOR (DRAPES) IMPLANT
DRAPE LAPAROTOMY 100X72X124 (DRAPES) ×2 IMPLANT
DRAPE MICROSCOPE SLANT 54X150 (MISCELLANEOUS) ×2 IMPLANT
DRAPE SHEET LG 3/4 BI-LAMINATE (DRAPES) ×8 IMPLANT
DRSG OPSITE POSTOP 4X6 (GAUZE/BANDAGES/DRESSINGS) IMPLANT
DURAPREP 26ML APPLICATOR (WOUND CARE) ×2 IMPLANT
ELECT BLADE INSULATED 6.5IN (ELECTROSURGICAL) ×1 IMPLANT
ELECT COATED BLADE 2.86 ST (ELECTRODE) ×2 IMPLANT
ELECT REM PT RETURN 9FT ADLT (ELECTROSURGICAL) ×1 IMPLANT
ELECTRODE BLDE INSULATED 6.5IN (ELECTROSURGICAL) ×2 IMPLANT
ELECTRODE REM PT RTRN 9FT ADLT (ELECTROSURGICAL) ×2 IMPLANT
EVACUATOR SILICONE 100CC (DRAIN) IMPLANT
EXTENDER TAB GUIDE SV 5.5/6.0 (INSTRUMENTS) IMPLANT
FEE COVERAGE SUPPORT O-ARM (MISCELLANEOUS) ×2 IMPLANT
GAUZE 4X4 16PLY ~~LOC~~+RFID DBL (SPONGE) IMPLANT
GAUZE SPONGE 4X4 12PLY STRL (GAUZE/BANDAGES/DRESSINGS) IMPLANT
GLOVE BIO SURGEON STRL SZ7 (GLOVE) ×8 IMPLANT
GLOVE BIOGEL PI IND STRL 7.5 (GLOVE) ×6 IMPLANT
GLOVE ECLIPSE 7.5 STRL STRAW (GLOVE) ×2 IMPLANT
GLOVE EXAM NITRILE XL STR (GLOVE) IMPLANT
GOWN STRL REUS W/ TWL LRG LVL3 (GOWN DISPOSABLE) ×2 IMPLANT
GOWN STRL REUS W/ TWL XL LVL3 (GOWN DISPOSABLE) ×4 IMPLANT
GOWN STRL REUS W/TWL 2XL LVL3 (GOWN DISPOSABLE) IMPLANT
GRAFT BONE PROTEIOS XS 0.5CC (Orthopedic Implant) IMPLANT
HEMOSTAT POWDER KIT SURGIFOAM (HEMOSTASIS) ×2 IMPLANT
KIT BASIN OR (CUSTOM PROCEDURE TRAY) ×2 IMPLANT
KIT TURNOVER KIT B (KITS) ×2 IMPLANT
MARKER SPHERE PSV REFLC NDI (MISCELLANEOUS) ×10 IMPLANT
MILL BONE PREP (MISCELLANEOUS) ×2 IMPLANT
NDL HYPO 18GX1.5 BLUNT FILL (NEEDLE) IMPLANT
NDL HYPO 21X1.5 SAFETY (NEEDLE) IMPLANT
NDL HYPO 22X1.5 SAFETY MO (MISCELLANEOUS) ×2 IMPLANT
NDL SPNL 18GX3.5 QUINCKE PK (NEEDLE) IMPLANT
NEEDLE HYPO 18GX1.5 BLUNT FILL (NEEDLE) IMPLANT
NEEDLE HYPO 21X1.5 SAFETY (NEEDLE) IMPLANT
NEEDLE HYPO 22X1.5 SAFETY MO (MISCELLANEOUS) ×1 IMPLANT
NEEDLE SPNL 18GX3.5 QUINCKE PK (NEEDLE) IMPLANT
NS IRRIG 1000ML POUR BTL (IV SOLUTION) ×2 IMPLANT
PACK LAMINECTOMY NEURO (CUSTOM PROCEDURE TRAY) ×2 IMPLANT
PAD ARMBOARD 7.5X6 YLW CONV (MISCELLANEOUS) ×6 IMPLANT
PIN BONE FIX 100 (PIN) IMPLANT
PUTTY GRAFTON DBF 6CC W/DELIVE (Putty) IMPLANT
ROD PERC CCM 5.5X35 (Rod) IMPLANT
SCREW 5.5 VOYAGER MAS 7.5X50 (Screw) IMPLANT
SCREW MAS VOYAGER 7.5X45 (Screw) IMPLANT
SCREW SET 5.5/6.0MM SOLERA (Screw) IMPLANT
SPACER TLIF CATALYFT 9X40 (Spacer) IMPLANT
SPIKE FLUID TRANSFER (MISCELLANEOUS) ×2 IMPLANT
SPONGE SURGIFOAM ABS GEL 100 (HEMOSTASIS) IMPLANT
SPONGE T-LAP 4X18 ~~LOC~~+RFID (SPONGE) IMPLANT
SUT MNCRL AB 4-0 PS2 18 (SUTURE) ×2 IMPLANT
SUT VIC AB 0 CT1 18XCR BRD8 (SUTURE) ×2 IMPLANT
SUT VIC AB 2-0 CP2 18 (SUTURE) ×2 IMPLANT
SYR 30ML LL (SYRINGE) ×2 IMPLANT
TOWEL GREEN STERILE (TOWEL DISPOSABLE) ×2 IMPLANT
TOWEL GREEN STERILE FF (TOWEL DISPOSABLE) ×2 IMPLANT
TRAY FOLEY MTR SLVR 16FR STAT (SET/KITS/TRAYS/PACK) ×2 IMPLANT
WATER STERILE IRR 1000ML POUR (IV SOLUTION) ×2 IMPLANT

## 2023-12-24 NOTE — Progress Notes (Signed)
Orthopedic Tech Progress Note Patient Details:  Ryan Phelps Oct 10, 1961 [id number]  Ortho Devices Type of Ortho Device: Lumbar corsett Ortho Device/Splint Location: BACK Ortho Device/Splint Interventions: Ordered   Post Interventions Patient Tolerated: Well Instructions Provided: Care of device  Donald Pore 12/24/2023, 2:05 PM

## 2023-12-24 NOTE — Plan of Care (Signed)

## 2023-12-24 NOTE — Anesthesia Procedure Notes (Signed)
Procedure Name: Intubation Date/Time: 12/24/2023 8:12 AM  Performed by: April Holding, CRNAPre-anesthesia Checklist: Patient identified, Emergency Drugs available, Suction available and Patient being monitored Patient Re-evaluated:Patient Re-evaluated prior to induction Oxygen Delivery Method: Circle system utilized Preoxygenation: Pre-oxygenation with 100% oxygen Induction Type: IV induction Ventilation: Mask ventilation without difficulty Laryngoscope Size: Mac and 4 Grade View: Grade III Tube type: Oral Tube size: 7.5 mm Number of attempts: 1 Airway Equipment and Method: Stylet and Oral airway Placement Confirmation: ETT inserted through vocal cords under direct vision, positive ETCO2 and breath sounds checked- equal and bilateral Secured at: 23 cm Tube secured with: Tape Dental Injury: Teeth and Oropharynx as per pre-operative assessment

## 2023-12-24 NOTE — Discharge Summary (Signed)
  Patient ID: Ryan Phelps MRN: [id number] DOB/AGE: [birth year] 64 y.o.  Admit date: 12/24/2023 Discharge date: 12/25/2023  Admission Diagnoses: Lumbar radiculopathy [M54.16]   Discharge Diagnoses: Same   Discharged Condition: Stable  Hospital Course:  Ryan Phelps is a 63 y.o. male who was admitted following an uncomplicated MIS TLIF L5-S1. They were recovered in PACU and transferred to [facility]. Hospital course was uncomplicated. Pt stable for discharge today. Pt to f/u in office for routine post op visit. Pt is in agreement w/ plan.    Discharge Exam: Blood pressure (!) 135/56, pulse 89, temperature 99 F (37.2 C), temperature source Oral, resp. rate 18, height 6' (1.829 m), weight 100.2 kg, SpO2 95%.   Disposition: Discharge disposition: 01-Home or Self Care       Discharge Instructions     Incentive spirometry RT   Complete by: As directed       Allergies as of 12/25/2023       Reactions   Penicillin G Other (See Comments)   unknown   Penicillins Other (See Comments)   Unknown childhood allergy        Medication List     STOP taking these medications    oxyCODONE-acetaminophen 5-325 MG tablet Commonly known as: PERCOCET/ROXICET       TAKE these medications    ALPRAZolam 0.5 MG tablet Commonly known as: XANAX Take 0.5 mg by mouth 4 (four) times daily as needed for anxiety.   Austedo 12 MG Tabs Generic drug: Deutetrabenazine Take 12 mg by mouth 2 (two) times daily.   Austedo 6 MG Tabs Generic drug: Deutetrabenazine Take 6 mg by mouth 2 (two) times daily.   carbamazepine 200 MG tablet Commonly known as: TEGRETOL Take 400 mg by mouth at bedtime.   docusate sodium 100 MG capsule Commonly known as: COLACE Take 1 capsule (100 mg total) by mouth 2 (two) times daily.   gabapentin 100 MG capsule Commonly known as: NEURONTIN Take 100 mg by mouth 3 (three) times daily.   lamoTRIgine 100 MG tablet Commonly known as: LAMICTAL Take 125 mg by  mouth at bedtime.   methocarbamol 500 MG tablet Commonly known as: ROBAXIN Take 1 tablet (500 mg total) by mouth every 6 (six) hours as needed for muscle spasms.   mupirocin ointment 2 % Commonly known as: BACTROBAN Place 1 Application into the nose 2 (two) times daily for 60 doses. Use as directed 2 times daily for 5 days every other week for 6 weeks.   NON FORMULARY CPAP machine uses nightly   Oxycodone HCl 10 MG Tabs Take 1 tablet (10 mg total) by mouth every 4 (four) hours as needed for severe pain (pain score 7-10).   pravastatin 20 MG tablet Commonly known as: PRAVACHOL Take 20 mg by mouth every evening.   testosterone cypionate 200 MG/ML injection Commonly known as: DEPOTESTOSTERONE CYPIONATE Inject 100 mg into the muscle every Wednesday.   zolpidem 10 MG tablet Commonly known as: AMBIEN Take 10 mg by mouth at bedtime.         Signed: Elvera Maria Brae Schaafsma 12/25/2023, 8:08 AM

## 2023-12-24 NOTE — Op Note (Signed)
PREOP DIAGNOSIS: L5-S1 recurrent disc herniation  POSTOP DIAGNOSIS: L5-S1 recurrent disc herniation  PROCEDURE: 1. L5-S1 lumbar interbody fusion via transforaminal approach with tubular retractors 2. Posterolateral arthrodesis, L5-S1 3. Placement of interbody cage L5-S1 4. Nonsegmental instrumentation with percutaneously placed pedicle screw and rod construct at L5-S1 5. Harvest of local autograft 6. Use of morselized allograft 7. Use of microscope for microdissection 8.  ntraoperative neuronavigation with Stealth 9. Intraoperative CT scan  SURGEON: Dr. Hoyt Koch, MD  ASSISTANT: Patrici Ranks, PA. Please note, there were no qualified trainees available to assist with the procedure.  An assistant was required for aid in retraction of the neural elements.   ANESTHESIA: General Endotracheal  EBL: 100 ml  IMPLANTS:  Medtronic 7.5 x 45 mm screws x 4 9 mm PL40 Catalyft cage, long 35 mm rods x 2 DBF ProteiOs  SPECIMENS: None  DRAINS: None  COMPLICATIONS: none  CONDITION: Stable to PACU  HISTORY: Ryan Phelps is a 63 y.o. male with hx of left L5-S1 microdiscectomy for large herniated disc who developed recurrent radiculopathy.  He was found to have a large recurrent calcified disc herniation with significant impingement of traversing nerve root.  Treatment options were discussed and the patient elected to proceed with MIS TLIF at L5-S1. risks, benefits, alternatives, and expected convalescence were discussed with the patient.  Risks discussed included but were not limited to bleeding, pain, infection, scar, pseudoarthrosis, CSF leak, neurologic deficit, paralysis, and death.  The patient wished to proceed with surgery and informed consent was obtained.  PROCEDURE IN DETAIL: After informed consent was obtained and witnessed, the patient was brought to the operating room. After induction of general anesthesia, the patient was positioned on the operative table in the prone  position on a Jackson table with all pressure points meticulously padded. The skin of the low back was then prepped and draped in the usual sterile fashion.  A PSIS iliac spine was placed, and intraoperative CT obtained to allow for neuronavigation.  Using the Stealth navigation, paramedian incisions were planned for pedicle screw trajectories.  Incisions were made with a 10 blade.  Navigated high speed drill was used to cannulate the pedicles, and pedicles were then tapped using navigation.  Ball ended feeler confirmed good cannulation with no breaches.  On the side contralateral to the TLIF, appropriately sized pedicle screws were placed using navigation.   Navigated initial dilator was then docked on the L5-S1 facet  on the leftside.  Sequential dilators and final tubular retractor was placed and locked in position, with navigation confirming good placement.  The microscope was then introduced in the field to allow for intraoperative microdissection.  Remaining muscle was moved off of the degenerated facet joint.  Laminotomy defect was identified medially.  The inferior facets was removed using an osteotome and harvested for autograft.  Overgrown superior facets was then removed with rongeurs.  Scar over the traversing nerve root was removed and good decompression was performed with removal of overgrown ligament and facets with rongeurs.  The exiting nerve root in the foramen was also seen.  Epidural veins in the foramen were coagulated and cut with allowed access to the disc space lateral to the traversing nerve root.  The traversing nerve root was very adhered to the herniated disc which was partially calcified.  Adhesions were cut sharply with microscissors and this allowed the nerve root to be gently retracted medially to allow greater access to the disc.  Calcified disc adherent to the underside of the  nerve root was removed with downpushing curettes. The disc was opened with 15 blade and pituitary rongeur  was used to remove disc material.  Disc shavers of increasing size was then used to clean the disc space as well as to restore disc space height.  The interbody space was prepared with rasps and curettes with removal of the cartilage from the endplates.  Appropriate sized interbody spacer was then sized.  The inner body space was packed with morselized allograft mixed with autograft as well as extra extra small BMP.  An expandable size 9 mm interbody spacer was then tamped into place with a mallet under x-ray guidance.  It was then expanded until snug with the endplates.  AP and lateral x-ray confirmed good position of the implant.  The wound was then irrigated thoroughly with bacitracin impregnated irrigation and meticulous hemostasis was obtained.  The retractor was then removed and meticulous hemostasis was obtained in the muscle layer and subcutaneous layer.  Remaining pedicle screws were then placed using navigation.  Good purchase was noted.  A second intraoperative CT was performed which confirmed excellent position of pedicle screws and interbody implant.  Rods were then passed through the screw towers bilaterally and secured with screw caps.  X-ray confirmed good placement.  The screw caps were final tightened and the towers removed.  Navigated drill was used to decorticate the facet joint contralateral to the TLIF, and bone graft was placed in the facet and lateral gutter.  Wounds were irrigated thoroughly .  Exparel mixed with Marcaine was injected into the paraspinous muscles and subcutaneous tissues bilaterally.  The fascia was closed with 0 Vicryl stitches.  The dermal layer was closed with 2-0 Vicryl stitches in buried interrupted fashion.  The skin incisions were closed with 4-0 Monocryl subcuticular manner followed by Dermabond.  The PSIS pin was removed and small incision closed with 3-0 vicryl in buried fashion and Dermabond.  Sterile dressings were placed.  Patient was then flipped supine and  extubated by the anesthesia service following commands and all 4 extremities.  All counts were correct at the end of surgery.  No complications were noted.

## 2023-12-24 NOTE — Transfer of Care (Signed)
Immediate Anesthesia Transfer of Care Note  Patient: Ryan Phelps  Procedure(s) Performed: Minimally Invasive Transforaminal Lumbar Interbody Fusion, Lumbar five-Sacral one (Left: Back) Application of O-Arm (Left: Back)  Patient Location: PACU  Anesthesia Type:General  Level of Consciousness: awake, alert , and oriented  Airway & Oxygen Therapy: Patient Spontanous Breathing and Patient connected to face mask oxygen  Post-op Assessment: Report given to RN and Post -op Vital signs reviewed and stable  Post vital signs: Reviewed and stable  Last Vitals:  Vitals Value Taken Time  BP 144/71 12/24/23 1150  Temp    Pulse 89 12/24/23 1152  Resp 23 12/24/23 1152  SpO2 95 % 12/24/23 1152  Vitals shown include unfiled device data.  Last Pain:  Vitals:   12/24/23 0556  TempSrc: Oral  PainSc:       Patients Stated Pain Goal: 4 (12/24/23 0553)  Complications: No notable events documented.

## 2023-12-24 NOTE — H&P (Signed)
CC:  left leg pain, back pain  HPI:     Patient is a 63 y.o. male with hx of L L5-S1 MLD developed recurrent disc herniation and increased back pain. Nonsurgical measures failed to control his symptoms.     There are no active problems to display for this patient.  Past Medical History:  Diagnosis Date   Allergy    Anxiety    Arthritis    Bipolar 2 disorder (HCC)    Depression    Dyspnea    GERD (gastroesophageal reflux disease)    Hyperlipidemia    Pre-diabetes    Seizures (HCC)    Sleep apnea    Tardive dyskinesia     Past Surgical History:  Procedure Laterality Date   LUMBAR MICRODISCECTOMY  2024   TOOTH EXTRACTION Right 2022   WISDOM TOOTH EXTRACTION Bilateral 1985    Medications Prior to Admission  Medication Sig Dispense Refill Last Dose/Taking   ALPRAZolam (XANAX) 0.5 MG tablet Take 0.5 mg by mouth 4 (four) times daily as needed for anxiety.   12/24/2023 Morning   carbamazepine (TEGRETOL) 200 MG tablet Take 400 mg by mouth at bedtime.   12/23/2023   Deutetrabenazine (AUSTEDO) 12 MG TABS Take 12 mg by mouth 2 (two) times daily.   12/24/2023 Morning   Deutetrabenazine (AUSTEDO) 6 MG TABS Take 6 mg by mouth 2 (two) times daily.   12/24/2023 Morning   gabapentin (NEURONTIN) 100 MG capsule Take 100 mg by mouth 3 (three) times daily.   12/24/2023 Morning   lamoTRIgine (LAMICTAL) 100 MG tablet Take 125 mg by mouth at bedtime.   12/23/2023   NON FORMULARY CPAP machine uses nightly   Taking   oxyCODONE-acetaminophen (PERCOCET/ROXICET) 5-325 MG tablet Take 1 tablet by mouth every 6 (six) hours as needed for severe pain (pain score 7-10).   12/23/2023   pravastatin (PRAVACHOL) 20 MG tablet Take 20 mg by mouth every evening.   12/23/2023   testosterone cypionate (DEPOTESTOSTERONE CYPIONATE) 200 MG/ML injection Inject 100 mg into the muscle every Wednesday.   Past Week   zolpidem (AMBIEN) 10 MG tablet Take 10 mg by mouth at bedtime.   12/23/2023   Allergies  Allergen Reactions    Penicillin G Other (See Comments)    unknown   Penicillins Other (See Comments)    Unknown childhood allergy    Social History   Tobacco Use   Smoking status: Never   Smokeless tobacco: Never  Substance Use Topics   Alcohol use: No    Family History  Problem Relation Age of Onset   Cancer Mother        leukemia   Heart disease Father    Sleep apnea Brother      Review of Systems Pertinent items are noted in HPI.  Objective:   Patient Vitals for the past 8 hrs:  BP Temp Temp src Pulse Resp SpO2 Height Weight  12/24/23 0556 129/88 98.1 F (36.7 C) Oral 64 18 95 % 6' (1.829 m) 100.2 kg   No intake/output data recorded. No intake/output data recorded.      General : Alert, cooperative, no distress, appears stated age   Head:  Normocephalic/atraumatic    Eyes: PERRL, conjunctiva/corneas clear, EOM's intact. Fundi could not be visualized Neck: Supple Chest:  Respirations unlabored Chest wall: no tenderness or deformity Heart: Regular rate and rhythm Abdomen: Soft, nontender and nondistended Extremities: warm and well-perfused Skin: normal turgor, color and texture Neurologic:  Alert, oriented x 3.  Eyes  open spontaneously. PERRL, EOMI, VFC, no facial droop. V1-3 intact.  No dysarthria, tongue protrusion symmetric.  CNII-XII intact. Normal strength, sensation and reflexes throughout.  No pronator drift, full strength in legs, + L SLR       Data ReviewCBC:  Lab Results  Component Value Date   WBC 3.1 (L) 12/19/2023   RBC 5.75 12/19/2023   BMP:  Lab Results  Component Value Date   GLUCOSE 237 (H) 12/24/2023   CO2 26 12/24/2023   BUN 15 12/24/2023   CREATININE 0.86 12/24/2023   CALCIUM 9.0 12/24/2023   Radiology review:  See clinic note  Assessment:   Active Problems:   * No active hospital problems. *  Recurrent L5-S1 disc herniation with significant discogenic pain and radiculopaty Plan:   Plan for L5-S1 MIS TLIF - Risks, benefits, alternatives,  and expected convalescence were discussed with her.  Risks discussed included, but were not limited to bleeding, pain, infection, scar, spinal fluid leak, neurologic deficit, instability, pseudoarthrosis, damage to nearby organs, and death.  Informed consent was obtained.

## 2023-12-25 DIAGNOSIS — M5117 Intervertebral disc disorders with radiculopathy, lumbosacral region: Secondary | ICD-10-CM | POA: Diagnosis not present

## 2023-12-25 MED ORDER — METHOCARBAMOL 500 MG PO TABS: 500.0000 mg | ORAL_TABLET | Freq: Four times a day (QID) | ORAL | 2 refills | Status: AC | PRN

## 2023-12-25 MED ORDER — DOCUSATE SODIUM 100 MG PO CAPS: 100.0000 mg | ORAL_CAPSULE | Freq: Two times a day (BID) | ORAL | 0 refills | Status: AC

## 2023-12-25 MED ORDER — OXYCODONE HCL 10 MG PO TABS: 10.0000 mg | ORAL_TABLET | ORAL | 0 refills | Status: AC | PRN

## 2023-12-25 NOTE — Plan of Care (Signed)

## 2023-12-25 NOTE — TOC Transition Note (Signed)
Transition of Care [facility]) - Discharge Note   Patient Details  Name: LIOR HOEN MRN: [id number] Date of Birth: 1961-04-10  Transition of [facility] CM/SW Contact:  Kermit Balo, RN Phone Number: 12/25/2023, 10:27 AM   Clinical Narrative:     Pt is discharging home with home health services through [facility]. Information on the AVS. [facility] will contact him for the first home visit. Bed rail order sent with patient in case he decides he needs this DME at home. Currently he is undecided. Spouse knows where to obtain the DME. Wife to provide transportation home.  Final next level of care: Home w Home Health Services Barriers to Discharge: No Barriers Identified   Patient Goals and CMS Choice   CMS Medicare.gov Compare Post Acute Care list provided to:: Patient Choice offered to / list presented to : Patient, Spouse      Discharge Placement                       Discharge Plan and Services Additional resources added to the After Visit Summary for                            [facility] Arranged: PT [facility] Agency: [facility] Date [facility] Agency Contacted: 12/25/23   Representative spoke with at [facility] Agency: Haywood Lasso  Social Drivers of Health (SDOH) Interventions SDOH Screenings   Depression (PHQ2-9): Low Risk  (07/29/2019)  Tobacco Use: Low Risk  (12/24/2023)     Readmission Risk Interventions     No data to display

## 2023-12-25 NOTE — Anesthesia Postprocedure Evaluation (Signed)
Anesthesia Post Note  Patient: Ryan Phelps  Procedure(s) Performed: Minimally Invasive Transforaminal Lumbar Interbody Fusion, Lumbar five-Sacral one (Left: Back) Application of O-Arm (Left: Back)     Patient location during evaluation: PACU Anesthesia Type: General Level of consciousness: awake Pain management: pain level controlled Vital Signs Assessment: post-procedure vital signs reviewed and stable Respiratory status: spontaneous breathing, nonlabored ventilation and respiratory function stable Cardiovascular status: blood pressure returned to baseline and stable Postop Assessment: no apparent nausea or vomiting Anesthetic complications: no   No notable events documented.  Last Vitals:  Vitals:   12/24/23 2352 12/25/23 0316  BP: 134/66 135/67  Pulse: 92 92  Resp: 20 20  Temp: 37.4 C 36.6 C  SpO2: 96% 98%    Last Pain:  Vitals:   12/25/23 0606  TempSrc:   PainSc: 7                  Imaad Reuss P Tymar Polyak

## 2023-12-25 NOTE — Evaluation (Addendum)
Physical Therapy Evaluation  Patient Details Name: Ryan Phelps MRN: [id number] DOB: 12-27-1960 Today's Date: 12/25/2023  History of Present Illness  Patient is a 63 yo male presenting to the hospital with L5-S1 fusion on 12/24/23. PMH includes:  HLD, pre-diabetes, OSA (uses CPAP), dyspnea, GERD, Bipolar 2 disorder, seizures, tardive dyskinesis (on [place]), spinal surgery (left L5-S1 microdiskectomy on 05/23/23   Clinical Impression  Pt admitted with above diagnosis. At the time of PT eval, pt was able to demonstrate transfers and ambulation with gross modified independence to CGA and RW for support. Pt was educated on precautions, brace application/wearing schedule, appropriate activity progression, and car transfer. Pt asking for a bed rail for home use. Pt currently with functional limitations due to the deficits listed below (see PT Problem List). Pt will benefit from skilled PT to increase their independence and safety with mobility to allow discharge to the venue listed below.          If plan is discharge home, recommend the following: A little help with walking and/or transfers;A little help with bathing/dressing/bathroom;Assistance with cooking/housework;Assist for transportation;Help with stairs or ramp for entrance   Can travel by private vehicle        Equipment Recommendations None recommended by PT  Recommendations for Other Services       Functional Status Assessment Patient has had a recent decline in their functional status and demonstrates the ability to make significant improvements in function in a reasonable and predictable amount of time.     Precautions / Restrictions Precautions Precautions: Back Precaution Booklet Issued: Yes (comment) Precaution Comments: verbally reviewed and handout provided Required Braces or Orthoses: Spinal Brace Spinal Brace: Lumbar corset;Applied in sitting position Restrictions Weight Bearing Restrictions Per Provider Order: No       Mobility  Bed Mobility Overal bed mobility: Modified Independent             General bed mobility comments: HOB flat and rails lowered to simulate home environment. No assist required. VC's throughout for sequencing and general safety.    Transfers Overall transfer level: Needs assistance Equipment used: Rolling walker (2 wheels) Transfers: Sit to/from Stand Sit to Stand: Contact guard assist           General transfer comment: No assist required. Hands on guarding for safety and pt was cued for hand placement on seated surface for safety.    Ambulation/Gait Ambulation/Gait assistance: Contact guard assist Gait Distance (Feet): 300 Feet   Gait Pattern/deviations: Step-through pattern, Decreased stride length, Trunk flexed Gait velocity: Decreased Gait velocity interpretation: 1.31 - 2.62 ft/sec, indicative of limited community ambulator   General Gait Details: VC's for improved posture, closer walker proximity and forward gaze. No assist required and no gross unsteadiness noted.  Stairs Stairs: Yes Stairs assistance: Contact guard assist, Mod assist Stair Management: One rail Right, Step to pattern, Forwards Number of Stairs: 4 (x2) General stair comments: Pt able to negotiate stairs with R railing only, however reports increased pain. Offered HHA on the L for added support, and pt putting excessive pressure through therapist's hand. Pt cued for R railing use mostly, however pt continued to excessively push through HHA. Pt wanting to try with wife so they could prepare for return home today. Pt again cued to use the R railing primarily for both his safety and for the safety of his wife. PT guarded both pt and wife during stair training.  Wheelchair Mobility     Tilt Bed    Modified Rankin (  Stroke Patients Only)       Balance Overall balance assessment: Mild deficits observed, not formally tested                                            Pertinent Vitals/Pain Pain Assessment Pain Assessment: 0-10 Pain Score: 8  Pain Location: incision Pain Descriptors / Indicators: Grimacing, Guarding, Discomfort Pain Intervention(s): Limited activity within patient's tolerance, Monitored during session, Repositioned    Home Living Family/patient expects to be discharged to:: Private residence Living Arrangements: Spouse/significant other Available Help at Discharge: Family;Available 24 hours/day Type of Home: House Home Access: Stairs to enter Entrance Stairs-Rails: Right Entrance Stairs-Number of Steps: 4 Alternate Level Stairs-Number of Steps: 16 Home Layout: Two level;Bed/bath upstairs;Able to live on main level with bedroom/bathroom Home Equipment: Rolling Walker (2 wheels);Cane - single point;BSC/3in1;Shower seat Additional Comments: Planning to stay on main level initially.    Prior Function Prior Level of Function : Needs assist             Mobility Comments: occasionally walks with a walker, was using a w/c to go to church or longer distances, recently more active ADLs Comments: wife had to complete lower body ADLs at baseline, instructed on slip on shoes and long handled shoe horn, did not drive     Extremity/Trunk Assessment   Upper Extremity Assessment Upper Extremity Assessment: Defer to OT evaluation    Lower Extremity Assessment Lower Extremity Assessment: Generalized weakness (Mild; consistent with pre-op diagnosis)    Cervical / Trunk Assessment Cervical / Trunk Assessment: Back Surgery  Communication   Communication Communication: No apparent difficulties Cueing Techniques: Verbal cues;Gestural cues  Cognition Arousal: Alert Behavior During Therapy: WFL for tasks assessed/performed Overall Cognitive Status: Within Functional Limits for tasks assessed                                          General Comments General comments (skin integrity, edema, etc.): incision site  clean    Exercises     Assessment/Plan    PT Assessment Patient needs continued PT services  PT Problem List Decreased strength;Decreased balance;Decreased activity tolerance;Decreased mobility;Decreased knowledge of use of DME;Decreased safety awareness;Decreased knowledge of precautions;Pain       PT Treatment Interventions DME instruction;Gait training;Stair training;Functional mobility training;Therapeutic activities;Therapeutic exercise;Balance training;Patient/family education    PT Goals (Current goals can be found in the Care Plan section)  Acute Rehab PT Goals Patient Stated Goal: Home today PT Goal Formulation: With patient/family Time For Goal Achievement: 01/01/24 Potential to Achieve Goals: Good    Frequency Min 5X/week     Co-evaluation               AM-PAC PT "6 Clicks" Mobility  Outcome Measure Help needed turning from your back to your side while in a flat bed without using bedrails?: None Help needed moving from lying on your back to sitting on the side of a flat bed without using bedrails?: None Help needed moving to and from a bed to a chair (including a wheelchair)?: A Little Help needed standing up from a chair using your arms (e.g., wheelchair or bedside chair)?: A Little Help needed to walk in hospital room?: A Little Help needed climbing 3-5 steps with a railing? : A Little 6 Click  Score: 20    End of Session Equipment Utilized During Treatment: Gait belt;Back brace Activity Tolerance: Patient tolerated treatment well Patient left: in chair;with call bell/phone within reach;with family/visitor present Nurse Communication: Mobility status PT Visit Diagnosis: Unsteadiness on feet (R26.81);Pain Pain - part of body:  (back)    Time: 1610-9604 PT Time Calculation (min) (ACUTE ONLY): 29 min   Charges:   PT Evaluation $PT Eval Low Complexity: 1 Low PT Treatments $Gait Training: 8-22 mins PT General Charges $$ ACUTE PT VISIT: 1 Visit          Conni Slipper, PT, DPT Acute Rehabilitation Services Secure Chat Preferred Office: [phone]   Marylynn Pearson 12/25/2023, 12:15 PM

## 2023-12-25 NOTE — Progress Notes (Signed)
Patient alert and oriented, voiding adequately, skin clean, dry and intact without evidence of skin break down, or symptoms of complications - no redness or edema noted, only slight tenderness at site.  Patient states pain is manageable at time of discharge. Room was checked and accounted for all patient's belongings; discharge instructions concerning her medications, incision care, follow up appointment and when to call the doctor as needed were all discussed with patient by RN and he expressed understanding on the instructions given.

## 2023-12-25 NOTE — Discharge Instructions (Addendum)
Wound Care Keep incision covered and dry until post op day 3. You may remove the Honeycomb dressing on post op day 3. Do not put any creams, lotions, or ointments on incision. You are fine to shower. Let water run over incision and pat dry.   Activity Walk each and every day, increasing distance each day. No lifting greater than 8 lbs.  No lifting no bending no twisting no driving or riding a car unless coming back and forth to see the doctor. If provided with back brace, wear when out of bed.  It is not necessary to wear brace in bed.  Diet Resume your normal diet.   Return to Work Will be discussed at your follow up appointment.  Call Your Doctor If Any of These Occur Redness, drainage, or swelling at the wound.  Temperature greater than 101 degrees. Severe pain not relieved by pain medication. Incision starts to come apart.  Follow Up Appt Call [phone] if you have one or any problem.

## 2023-12-25 NOTE — Evaluation (Signed)
Occupational Therapy Evaluation Patient Details Name: Ryan Phelps MRN: [id number] DOB: 1961/08/13 Today's Date: 12/25/2023   History of Present Illness Patient is a 63 yo male presenting to the hospital with L5-S1 fusion on 12/24/23. PMH includes:  HLD, pre-diabetes, OSA (uses CPAP), dyspnea, GERD, Bipolar 2 disorder, seizures, tardive dyskinesis (on [place]), spinal surgery (left L5-S1 microdiskectomy on 05/23/23   Clinical Impression   Prior to this admission, patient occasionally walking with a walker, and was using a w/c for longer distances for 3 months after surgery in June of 24. Patient did not drive, and could not complete lower body ADLs due to back pain. Currently, patient is at his baseline for previous level of function, with adaptive strategies for lower body dressing provided to promote independence. Patient CGA for transfers with RW. Patient has no further acute needs, and is able to discharge home with current level of support. No OT follow up is required at discharge.       If plan is discharge home, recommend the following: A little help with walking and/or transfers;A little help with bathing/dressing/bathroom;Assist for transportation;Help with stairs or ramp for entrance    Functional Status Assessment  Patient has had a recent decline in their functional status and demonstrates the ability to make significant improvements in function in a reasonable and predictable amount of time.  Equipment Recommendations  None recommended by OT    Recommendations for Other Services       Precautions / Restrictions Precautions Precautions: Back Precaution Booklet Issued: Yes (comment) Precaution Comments: verbally reviewed and handout provided Required Braces or Orthoses: Spinal Brace Spinal Brace: Lumbar corset;Applied in sitting position Restrictions Weight Bearing Restrictions Per Provider Order: No      Mobility Bed Mobility               General bed mobility  comments: up in chair upon arrival, discussed strategies for upright bed positioning due to apnea    Transfers Overall transfer level: Needs assistance Equipment used: Rolling walker (2 wheels) Transfers: Sit to/from Stand Sit to Stand: Contact guard assist           General transfer comment: CGA from recliner x3, and from regular height toilet      Balance Overall balance assessment: Mild deficits observed, not formally tested                                         ADL either performed or assessed with clinical judgement   ADL Overall ADL's : Needs assistance/impaired Eating/Feeding: Set up;Sitting   Grooming: Wash/dry hands;Set up;Standing   Upper Body Bathing: Set up;Sitting   Lower Body Bathing: Moderate assistance;Sitting/lateral leans;Sit to/from stand Lower Body Bathing Details (indicate cue type and reason): at baseline function Upper Body Dressing : Set up;Sitting   Lower Body Dressing: Moderate assistance;Sit to/from stand;Sitting/lateral leans   Toilet Transfer: Contact guard assist;Ambulation;Regular Toilet;Rolling walker (2 wheels)   Toileting- Clothing Manipulation and Hygiene: Contact guard assist;Sitting/lateral lean;Sit to/from stand       Functional mobility during ADLs: Contact guard assist;Cueing for safety;Cueing for sequencing;Rolling walker (2 wheels) General ADL Comments: Prior to this admission, patient occasionally walking with a walker, and was using a w/c for longer distances for 3 months after surgery in June of 24. Patient did not drive, and could not complete lower body ADLs due to back pain. Currently, patient is at his baseline for  previous level of function, with adaptive strategies for lower body dressing provided to promote independence. Patient CGA for transfers with RW. Patient has no further acute needs, and is able to discharge home with current level of support. No OT follow up is required at discharge.      Vision Baseline Vision/History: 1 Wears glasses Ability to See in Adequate Light: 0 Adequate Patient Visual Report: No change from baseline Vision Assessment?: Wears glasses for reading     Perception Perception: Not tested       Praxis Praxis: Not tested       Pertinent Vitals/Pain Pain Assessment Pain Assessment: 0-10 Pain Score: 8  Pain Location: incision Pain Descriptors / Indicators: Grimacing, Guarding, Discomfort Pain Intervention(s): Limited activity within patient's tolerance, Monitored during session, Repositioned     Extremity/Trunk Assessment Upper Extremity Assessment Upper Extremity Assessment: Right hand dominant;Overall [facility] for tasks assessed   Lower Extremity Assessment Lower Extremity Assessment: Defer to PT evaluation   Cervical / Trunk Assessment Cervical / Trunk Assessment: Back Surgery   Communication Communication Communication: No apparent difficulties Cueing Techniques: Verbal cues   Cognition Arousal: Alert Behavior During Therapy: WFL for tasks assessed/performed Overall Cognitive Status: Within Functional Limits for tasks assessed                                       General Comments  incision site clean    Exercises     Shoulder Instructions      Home Living Family/patient expects to be discharged to:: Private residence Living Arrangements: Spouse/significant other Available Help at Discharge: Family;Available 24 hours/day Type of Home: House Home Access: Stairs to enter [employer] of Steps: 4   Home Layout: Two level;Bed/bath upstairs Alternate Level Stairs-Number of Steps: 16   Bathroom Shower/Tub: Tub/shower unit;Walk-in shower   Bathroom Toilet: Standard Bathroom Accessibility: Yes   Home Equipment: Agricultural consultant (2 wheels);Cane - single point;BSC/3in1;Shower seat          Prior Functioning/Environment Prior Level of Function : Needs assist             Mobility Comments:  occasionally walks with a walker, was using a w/c to go to church or longer distances, recently more active ADLs Comments: wife had to complete lower body ADLs at baseline, instructed on slip on shoes and long handled shoe horn, did not drive        OT Problem List: Decreased strength;Decreased range of motion;Decreased activity tolerance;Decreased knowledge of use of DME or AE;Decreased knowledge of precautions;Pain      OT Treatment/Interventions:      OT Goals(Current goals can be found in the care plan section) Acute Rehab OT Goals Patient Stated Goal: to get stronger OT Goal Formulation: With patient/family Time For Goal Achievement: 01/08/24 Potential to Achieve Goals: Good  OT Frequency:      Co-evaluation              AM-PAC OT "6 Clicks" Daily Activity     Outcome Measure Help from another person eating meals?: A Little Help from another person taking care of personal grooming?: A Little Help from another person toileting, which includes using toliet, bedpan, or urinal?: A Little Help from another person bathing (including washing, rinsing, drying)?: A Lot Help from another person to put on and taking off regular upper body clothing?: A Little Help from another person to put on and taking off regular  lower body clothing?: A Lot 6 Click Score: 16   End of Session Equipment Utilized During Treatment: Gait belt;Rolling walker (2 wheels);Back brace Nurse Communication: Mobility status  Activity Tolerance: Patient tolerated treatment well Patient left: in chair;with call bell/phone within reach;with family/visitor present  OT Visit Diagnosis: Unsteadiness on feet (R26.81);Muscle weakness (generalized) (M62.81);Pain Pain - part of body:  (Back)                Time: 1610-9604 OT Time Calculation (min): 27 min Charges:  OT General Charges $OT Visit: 1 Visit OT Evaluation $OT Eval Moderate Complexity: 1 Mod OT Treatments $Self Care/Home Management : 8-22  mins  Pollyann Glen E. Mayank Teuscher, OTR/L Acute Rehabilitation Services [phone]   Cherlyn Cushing 12/25/2023, 9:39 AM

## 2023-12-27 ENCOUNTER — Encounter (HOSPITAL_COMMUNITY): Payer: Self-pay | Admitting: Neurosurgery

## 2024-06-12 ENCOUNTER — Encounter: Payer: Self-pay | Admitting: Advanced Practice Midwife

## 2024-12-21 ENCOUNTER — Other Ambulatory Visit: Payer: Self-pay | Admitting: Surgery
